# Patient Record
Sex: Male | Born: 1980 | Race: Black or African American | Hispanic: No | Marital: Single | Smoking: Current every day smoker
Health system: Southern US, Community
[De-identification: ages and names within clinical notes are randomized; demographics above are authoritative.]

## PROBLEM LIST (undated history)

## (undated) HISTORY — PX: OTHER SURGICAL HISTORY: SHX169

---

## 2001-10-02 ENCOUNTER — Encounter: Payer: Self-pay | Admitting: Emergency Medicine

## 2001-10-02 ENCOUNTER — Emergency Department (HOSPITAL_COMMUNITY): Admission: EM | Admit: 2001-10-02 | Discharge: 2001-10-02 | Payer: Self-pay | Admitting: Emergency Medicine

## 2003-06-16 ENCOUNTER — Emergency Department (HOSPITAL_COMMUNITY): Admission: EM | Admit: 2003-06-16 | Discharge: 2003-06-17 | Payer: Self-pay | Admitting: Emergency Medicine

## 2003-06-17 ENCOUNTER — Encounter: Payer: Self-pay | Admitting: Emergency Medicine

## 2003-06-19 ENCOUNTER — Emergency Department (HOSPITAL_COMMUNITY): Admission: EM | Admit: 2003-06-19 | Discharge: 2003-06-19 | Payer: Self-pay | Admitting: Emergency Medicine

## 2003-06-22 ENCOUNTER — Emergency Department (HOSPITAL_COMMUNITY): Admission: EM | Admit: 2003-06-22 | Discharge: 2003-06-22 | Payer: Self-pay | Admitting: Emergency Medicine

## 2003-06-26 ENCOUNTER — Emergency Department (HOSPITAL_COMMUNITY): Admission: EM | Admit: 2003-06-26 | Discharge: 2003-06-26 | Payer: Self-pay | Admitting: Emergency Medicine

## 2003-08-25 ENCOUNTER — Emergency Department (HOSPITAL_COMMUNITY): Admission: EM | Admit: 2003-08-25 | Discharge: 2003-08-25 | Payer: Self-pay

## 2003-09-04 ENCOUNTER — Emergency Department (HOSPITAL_COMMUNITY): Admission: EM | Admit: 2003-09-04 | Discharge: 2003-09-04 | Payer: Self-pay | Admitting: Emergency Medicine

## 2003-09-13 ENCOUNTER — Emergency Department (HOSPITAL_COMMUNITY): Admission: EM | Admit: 2003-09-13 | Discharge: 2003-09-13 | Payer: Self-pay | Admitting: Emergency Medicine

## 2003-11-16 ENCOUNTER — Emergency Department (HOSPITAL_COMMUNITY): Admission: EM | Admit: 2003-11-16 | Discharge: 2003-11-17 | Payer: Self-pay | Admitting: Emergency Medicine

## 2004-01-29 ENCOUNTER — Emergency Department (HOSPITAL_COMMUNITY): Admission: EM | Admit: 2004-01-29 | Discharge: 2004-01-29 | Payer: Self-pay | Admitting: Emergency Medicine

## 2004-02-10 ENCOUNTER — Emergency Department (HOSPITAL_COMMUNITY): Admission: EM | Admit: 2004-02-10 | Discharge: 2004-02-10 | Payer: Self-pay | Admitting: Emergency Medicine

## 2004-08-31 ENCOUNTER — Emergency Department (HOSPITAL_COMMUNITY): Admission: EM | Admit: 2004-08-31 | Discharge: 2004-08-31 | Payer: Self-pay | Admitting: Emergency Medicine

## 2008-08-22 ENCOUNTER — Emergency Department (HOSPITAL_COMMUNITY): Admission: EM | Admit: 2008-08-22 | Discharge: 2008-08-22 | Payer: Self-pay | Admitting: Emergency Medicine

## 2008-10-09 ENCOUNTER — Emergency Department (HOSPITAL_COMMUNITY): Admission: EM | Admit: 2008-10-09 | Discharge: 2008-10-09 | Payer: Self-pay | Admitting: Family Medicine

## 2009-08-23 ENCOUNTER — Emergency Department (HOSPITAL_COMMUNITY): Admission: EM | Admit: 2009-08-23 | Discharge: 2009-08-24 | Payer: Self-pay | Admitting: Emergency Medicine

## 2009-11-09 ENCOUNTER — Emergency Department (HOSPITAL_COMMUNITY): Admission: EM | Admit: 2009-11-09 | Discharge: 2009-11-10 | Payer: Self-pay | Admitting: Emergency Medicine

## 2010-01-05 ENCOUNTER — Emergency Department (HOSPITAL_COMMUNITY): Admission: EM | Admit: 2010-01-05 | Discharge: 2010-01-05 | Payer: Self-pay | Admitting: Emergency Medicine

## 2010-04-23 ENCOUNTER — Emergency Department (HOSPITAL_COMMUNITY): Admission: EM | Admit: 2010-04-23 | Discharge: 2010-04-23 | Payer: Self-pay | Admitting: Emergency Medicine

## 2010-05-04 ENCOUNTER — Emergency Department (HOSPITAL_COMMUNITY): Admission: EM | Admit: 2010-05-04 | Discharge: 2010-05-04 | Payer: Self-pay | Admitting: Emergency Medicine

## 2010-07-24 ENCOUNTER — Emergency Department (HOSPITAL_COMMUNITY): Admission: EM | Admit: 2010-07-24 | Discharge: 2010-07-24 | Payer: Self-pay | Admitting: Emergency Medicine

## 2010-08-02 ENCOUNTER — Emergency Department (HOSPITAL_COMMUNITY): Admission: EM | Admit: 2010-08-02 | Discharge: 2010-08-02 | Payer: Self-pay | Admitting: Family Medicine

## 2010-11-22 ENCOUNTER — Emergency Department (HOSPITAL_COMMUNITY)
Admission: EM | Admit: 2010-11-22 | Discharge: 2010-11-22 | Payer: Self-pay | Source: Home / Self Care | Admitting: Emergency Medicine

## 2011-01-14 ENCOUNTER — Emergency Department (HOSPITAL_COMMUNITY): Admission: EM | Admit: 2011-01-14 | Discharge: 2011-01-14 | Source: Home / Self Care | Admitting: Emergency Medicine

## 2011-03-12 LAB — WOUND CULTURE

## 2011-03-14 LAB — BASIC METABOLIC PANEL
BUN: 8 mg/dL (ref 6–23)
Chloride: 108 mEq/L (ref 96–112)
GFR calc non Af Amer: >60 mL/min (ref >60)
Glucose, Bld: 94 mg/dL (ref 70–99)
Potassium: 3.9 mEq/L (ref 3.5–5.1)
Sodium: 141 mEq/L (ref 135–145)

## 2011-03-14 LAB — DIFFERENTIAL
Eosinophils Absolute: 0.1 10*3/uL (ref 0.0–0.7)
Lymphs Abs: 2.3 10*3/uL (ref 0.7–4.0)
Monocytes Absolute: 0.2 10*3/uL (ref 0.1–1.0)
Monocytes Relative: 3 % (ref 3–12)
Neutro Abs: 4.2 10*3/uL (ref 1.7–7.7)

## 2011-03-14 LAB — CBC
Hemoglobin: 13.3 g/dL (ref 13.0–17.0)
MCV: 93.3 fL (ref 78.0–100.0)
WBC: 6.8 10*3/uL (ref 4.0–10.5)

## 2011-04-03 ENCOUNTER — Inpatient Hospital Stay (INDEPENDENT_AMBULATORY_CARE_PROVIDER_SITE_OTHER)
Admission: RE | Admit: 2011-04-03 | Discharge: 2011-04-03 | Disposition: A | Discharge status: Home / Self Care | Payer: Self-pay | Source: Ambulatory Visit | Attending: Family Medicine | Admitting: Family Medicine

## 2011-04-03 ENCOUNTER — Ambulatory Visit (INDEPENDENT_AMBULATORY_CARE_PROVIDER_SITE_OTHER): Payer: Self-pay

## 2011-04-03 DIAGNOSIS — M79609 Pain in unspecified limb: Secondary | ICD-10-CM

## 2011-07-04 ENCOUNTER — Inpatient Hospital Stay (INDEPENDENT_AMBULATORY_CARE_PROVIDER_SITE_OTHER)
Admission: RE | Admit: 2011-07-04 | Discharge: 2011-07-04 | Disposition: A | Discharge status: Home / Self Care | Payer: Self-pay | Source: Ambulatory Visit | Attending: Emergency Medicine | Admitting: Emergency Medicine

## 2011-07-04 DIAGNOSIS — M549 Dorsalgia, unspecified: Secondary | ICD-10-CM

## 2011-07-04 DIAGNOSIS — L723 Sebaceous cyst: Secondary | ICD-10-CM

## 2011-08-06 ENCOUNTER — Emergency Department (HOSPITAL_COMMUNITY): Payer: Self-pay

## 2011-08-06 ENCOUNTER — Emergency Department (HOSPITAL_COMMUNITY)
Admission: EM | Admit: 2011-08-06 | Discharge: 2011-08-06 | Disposition: A | Discharge status: Home / Self Care | Payer: Self-pay

## 2011-08-06 DIAGNOSIS — R4182 Altered mental status, unspecified: Secondary | ICD-10-CM | POA: Insufficient documentation

## 2011-08-06 DIAGNOSIS — Y998 Other external cause status: Secondary | ICD-10-CM | POA: Insufficient documentation

## 2011-08-06 DIAGNOSIS — T07XXXA Unspecified multiple injuries, initial encounter: Secondary | ICD-10-CM

## 2011-08-06 DIAGNOSIS — Y9239 Other specified sports and athletic area as the place of occurrence of the external cause: Secondary | ICD-10-CM | POA: Insufficient documentation

## 2011-08-06 DIAGNOSIS — F1010 Alcohol abuse, uncomplicated: Secondary | ICD-10-CM | POA: Insufficient documentation

## 2011-08-06 DIAGNOSIS — S01409A Unspecified open wound of unspecified cheek and temporomandibular area, initial encounter: Secondary | ICD-10-CM | POA: Insufficient documentation

## 2011-08-06 DIAGNOSIS — S0083XA Contusion of other part of head, initial encounter: Secondary | ICD-10-CM | POA: Insufficient documentation

## 2011-08-06 DIAGNOSIS — S0003XA Contusion of scalp, initial encounter: Secondary | ICD-10-CM | POA: Insufficient documentation

## 2011-08-06 DIAGNOSIS — S022XXA Fracture of nasal bones, initial encounter for closed fracture: Secondary | ICD-10-CM | POA: Insufficient documentation

## 2011-08-06 LAB — POCT I-STAT, CHEM 8
BUN: 7 mg/dL (ref 6–23)
Calcium, Ion: 1.07 mmol/L — ABNORMAL LOW (ref 1.12–1.32)
Chloride: 104 meq/L (ref 96–112)
Creatinine, Ser: 1.6 mg/dL — ABNORMAL HIGH (ref 0.50–1.35)
Sodium: 144 meq/L (ref 135–145)

## 2011-08-06 LAB — COMPREHENSIVE METABOLIC PANEL
ALT: 12 U/L (ref 0–53)
AST: 19 U/L (ref 0–37)
Calcium: 8.8 mg/dL (ref 8.4–10.5)
Sodium: 141 mEq/L (ref 135–145)
Total Protein: 6.8 g/dL (ref 6.0–8.3)

## 2011-08-06 LAB — CBC
Hemoglobin: 12.5 g/dL — ABNORMAL LOW (ref 13.0–17.0)
MCV: 91.5 fL (ref 78.0–100.0)
Platelets: 270 10*3/uL (ref 150–400)
RBC: 4.13 MIL/uL — ABNORMAL LOW (ref 4.22–5.81)
WBC: 7.2 10*3/uL (ref 4.0–10.5)

## 2011-08-06 LAB — TYPE AND SCREEN: ABO/RH(D): O POS

## 2011-08-06 LAB — LACTIC ACID, PLASMA: Lactic Acid, Venous: 2.8 mmol/L — ABNORMAL HIGH (ref 0.5–2.2)

## 2011-08-06 LAB — URINALYSIS, ROUTINE W REFLEX MICROSCOPIC
Glucose, UA: NEGATIVE mg/dL
Hgb urine dipstick: NEGATIVE
Specific Gravity, Urine: 1.039 — ABNORMAL HIGH (ref 1.005–1.030)
Urobilinogen, UA: 0.2 mg/dL (ref 0.0–1.0)
pH: 5 (ref 5.0–8.0)

## 2011-08-06 LAB — RAPID URINE DRUG SCREEN, HOSP PERFORMED
Amphetamines: NOT DETECTED
Cocaine: NOT DETECTED
Opiates: NOT DETECTED
Tetrahydrocannabinol: POSITIVE — AB

## 2011-08-06 LAB — ETHANOL: Alcohol, Ethyl (B): 249 mg/dL — ABNORMAL HIGH (ref 0–11)

## 2011-08-06 LAB — PROTIME-INR: Prothrombin Time: 13.2 seconds (ref 11.6–15.2)

## 2011-08-06 MED ORDER — IOHEXOL 300 MG/ML  SOLN
100.0000 mL | Freq: Once | INTRAMUSCULAR | Status: AC | PRN
  Administered 2011-08-06: 100 mL via INTRAVENOUS

## 2011-08-14 ENCOUNTER — Emergency Department (HOSPITAL_COMMUNITY)
Admission: EM | Admit: 2011-08-14 | Discharge: 2011-08-15 | Disposition: A | Discharge status: Home / Self Care | Payer: Self-pay | Attending: Emergency Medicine | Admitting: Emergency Medicine

## 2011-08-14 DIAGNOSIS — Z4802 Encounter for removal of sutures: Secondary | ICD-10-CM | POA: Insufficient documentation
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
## 2011-08-30 ENCOUNTER — Emergency Department (HOSPITAL_COMMUNITY)
Admission: EM | Admit: 2011-08-30 | Discharge: 2011-08-30 | Disposition: A | Discharge status: Home / Self Care | Payer: Self-pay | Attending: Emergency Medicine | Admitting: Emergency Medicine

## 2011-08-30 DIAGNOSIS — R109 Unspecified abdominal pain: Secondary | ICD-10-CM | POA: Insufficient documentation

## 2011-08-30 DIAGNOSIS — M545 Low back pain, unspecified: Secondary | ICD-10-CM | POA: Insufficient documentation

## 2011-08-30 DIAGNOSIS — R319 Hematuria, unspecified: Secondary | ICD-10-CM | POA: Insufficient documentation

## 2011-08-30 LAB — URINALYSIS, ROUTINE W REFLEX MICROSCOPIC
Glucose, UA: NEGATIVE mg/dL
Specific Gravity, Urine: 1.023 (ref 1.005–1.030)

## 2011-09-01 ENCOUNTER — Emergency Department (HOSPITAL_COMMUNITY)
Admission: EM | Admit: 2011-09-01 | Discharge: 2011-09-02 | Disposition: A | Discharge status: Home / Self Care | Payer: Self-pay | Attending: Emergency Medicine | Admitting: Emergency Medicine

## 2011-09-01 DIAGNOSIS — R109 Unspecified abdominal pain: Secondary | ICD-10-CM | POA: Insufficient documentation

## 2011-09-01 DIAGNOSIS — R319 Hematuria, unspecified: Secondary | ICD-10-CM | POA: Insufficient documentation

## 2011-09-01 LAB — URINALYSIS, ROUTINE W REFLEX MICROSCOPIC
Bilirubin Urine: NEGATIVE
Glucose, UA: NEGATIVE mg/dL
Protein, ur: NEGATIVE mg/dL
Specific Gravity, Urine: 1.023 (ref 1.005–1.030)
Urobilinogen, UA: 1 mg/dL (ref 0.0–1.0)

## 2011-09-02 ENCOUNTER — Emergency Department (HOSPITAL_COMMUNITY): Payer: Self-pay

## 2013-05-17 ENCOUNTER — Emergency Department (HOSPITAL_COMMUNITY)
Admission: EM | Admit: 2013-05-17 | Discharge: 2013-05-17 | Disposition: A | Discharge status: Home / Self Care | Payer: Self-pay | Attending: Emergency Medicine | Admitting: Emergency Medicine

## 2013-05-17 ENCOUNTER — Encounter (HOSPITAL_COMMUNITY): Payer: Self-pay | Admitting: Emergency Medicine

## 2013-05-17 DIAGNOSIS — IMO0001 Reserved for inherently not codable concepts without codable children: Secondary | ICD-10-CM | POA: Insufficient documentation

## 2013-05-17 DIAGNOSIS — J329 Chronic sinusitis, unspecified: Secondary | ICD-10-CM | POA: Insufficient documentation

## 2013-05-17 DIAGNOSIS — R509 Fever, unspecified: Secondary | ICD-10-CM | POA: Insufficient documentation

## 2013-05-17 DIAGNOSIS — R1110 Vomiting, unspecified: Secondary | ICD-10-CM | POA: Insufficient documentation

## 2013-05-17 DIAGNOSIS — R61 Generalized hyperhidrosis: Secondary | ICD-10-CM | POA: Insufficient documentation

## 2013-05-17 DIAGNOSIS — R05 Cough: Secondary | ICD-10-CM | POA: Insufficient documentation

## 2013-05-17 DIAGNOSIS — R059 Cough, unspecified: Secondary | ICD-10-CM | POA: Insufficient documentation

## 2013-05-17 DIAGNOSIS — R0982 Postnasal drip: Secondary | ICD-10-CM | POA: Insufficient documentation

## 2013-05-17 DIAGNOSIS — H9209 Otalgia, unspecified ear: Secondary | ICD-10-CM | POA: Insufficient documentation

## 2013-05-17 DIAGNOSIS — B9689 Other specified bacterial agents as the cause of diseases classified elsewhere: Secondary | ICD-10-CM

## 2013-05-17 DIAGNOSIS — F17200 Nicotine dependence, unspecified, uncomplicated: Secondary | ICD-10-CM | POA: Insufficient documentation

## 2013-05-17 DIAGNOSIS — L723 Sebaceous cyst: Secondary | ICD-10-CM | POA: Insufficient documentation

## 2013-05-17 DIAGNOSIS — K5900 Constipation, unspecified: Secondary | ICD-10-CM | POA: Insufficient documentation

## 2013-05-17 DIAGNOSIS — J069 Acute upper respiratory infection, unspecified: Secondary | ICD-10-CM | POA: Diagnosis present

## 2013-05-17 DIAGNOSIS — R220 Localized swelling, mass and lump, head: Secondary | ICD-10-CM | POA: Insufficient documentation

## 2013-05-17 MED ORDER — AMOXICILLIN 500 MG PO CAPS: 500.0000 mg | ORAL_CAPSULE | Freq: Two times a day (BID) | ORAL | Status: DC

## 2013-05-17 MED ORDER — PSEUDOEPHEDRINE HCL 30 MG PO TABS: 30.0000 mg | ORAL_TABLET | Freq: Four times a day (QID) | ORAL | Status: DC | PRN

## 2013-05-17 MED ORDER — IBUPROFEN 800 MG PO TABS: 800.0000 mg | ORAL_TABLET | Freq: Once | ORAL | Status: DC

## 2013-05-17 MED ORDER — IBUPROFEN 800 MG PO TABS
800.0000 mg | ORAL_TABLET | Freq: Once | ORAL | Status: AC
  Administered 2013-05-17: 800 mg via ORAL
  Filled 2013-05-17: qty 1

## 2013-05-17 NOTE — ED Notes (Addendum)
Pt presents to ED today with c/o sinus infection that started Monday, facial pain. Patient stated he took OTC meds without relief. Pt also has large growth on back of neck he would like checked.

## 2013-05-17 NOTE — ED Provider Notes (Signed)
History     CSN: [id number] Arrival date & time 05/17/13  1725  First MD Initiated Contact with Patient 05/17/13 1739     Chief Complaint  Patient presents with  . Recurrent Sinusitis   HPI Comments: Patient is a 32 year old otherwise healthy male who presents with complaints:  # nasal congestion: Patient reports an 8 day history of nasal congestion that began with a small amount rhinorrhea which seem to be improving until 5 days ago when he started having increasing nasal congestion, painful chewing his teeth, pressure behind his eyes and pressure in his ears.  No overt hearing loss.  Denies any objective fevers but does report becoming diaphoretic on occasion.  No rigors.  Reports a two-day onset of mildly productive cough with occasional post-tussive emesis.  Non bloody non bilious.  He is a smoker.  He is try taking Claritin and and over-the-counter cold and flu medicine.  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 Reports that it has continued to grow and is now causing him discomfort when he lays supine.  Reports that his father had a similar cyst.  Reports that his never drain or become significantly inflamed.  He is not seen a surgeon for this previously and was told he would need insurance to have this taken care of.  He is not apply for insurance through healthcare exchange.   He does not have a primary care provider.   History reviewed. No pertinent past medical history.  History reviewed. No pertinent past surgical history.  History reviewed. No pertinent family history.  History  Substance Use Topics  . Smoking status: Current Every Day Smoker    Types: Cigarettes  . Smokeless tobacco: Not on file  . Alcohol Use: Yes    Review of Systems  Constitutional: Positive for fever (subjective only) and diaphoresis. Negative for chills, activity change, appetite change and  fatigue.  HENT: Positive for hearing loss (dullness in his hearing but no overt loss), ear pain (pressure only), congestion, facial swelling, rhinorrhea, dental problem (pain with chewing in his upper teeth), postnasal drip and sinus pressure. Negative for nosebleeds, sore throat, sneezing, drooling, mouth sores, trouble swallowing, neck pain, neck stiffness, voice change, tinnitus and ear discharge.   Eyes: Negative for photophobia, pain, discharge, redness and visual disturbance.  Respiratory: Positive for cough. Negative for chest tightness, shortness of breath, wheezing and stridor.   Cardiovascular: Negative for chest pain, palpitations and leg swelling.  Gastrointestinal: Positive for constipation. Negative for nausea, vomiting, abdominal pain, diarrhea, blood in stool, abdominal distention, anal bleeding and rectal pain.  Endocrine: Negative.   Genitourinary: Negative for dysuria, frequency, flank pain and difficulty urinating.  Musculoskeletal: Positive for myalgias (generalized whole body at the beginning of his symptoms).  Skin: Negative.   Allergic/Immunologic: Negative.  Negative for environmental allergies.  Neurological: Negative.   Hematological: Negative.     Allergies  Tuberculin tests  Home Medications  No current outpatient prescriptions on file.  BP 120/83  Pulse 79  Temp(Src) 97.7 F (36.5 C) (Oral)  Resp 14  SpO2 99%  Physical Exam  Nursing note and vitals reviewed. Constitutional: He appears well-developed and well-nourished.  Non-toxic appearance. He does not appear ill. No distress.  HENT:  Head: Normocephalic and atraumatic. Head is without raccoon's eyes.  Right Ear: Hearing normal. No mastoid tenderness. Tympanic membrane is injected. Tympanic membrane is not perforated and  not erythematous. Tympanic membrane mobility is normal. A middle ear effusion is present.  Left Ear: Hearing normal. No mastoid tenderness. Tympanic membrane is not injected, not  perforated and not erythematous. Tympanic membrane mobility is normal. A middle ear effusion is present.  Nose: Mucosal edema, rhinorrhea and sinus tenderness present. No septal deviation or nasal septal hematoma. No epistaxis. Right sinus exhibits maxillary sinus tenderness. Left sinus exhibits maxillary sinus tenderness.  Eyes: Conjunctivae are normal. Right eye exhibits no discharge. Left eye exhibits no discharge. No scleral icterus.  Neck: No JVD present. No tracheal deviation present.    Cardiovascular: Normal rate, regular rhythm, normal heart sounds and intact distal pulses.  Exam reveals no gallop and no friction rub.   No murmur heard. Pulmonary/Chest: Effort normal and breath sounds normal. No respiratory distress. He has no wheezes. He has no rales.  Abdominal: Soft.  Musculoskeletal: Normal range of motion. He exhibits no edema.  Neurological: He is alert. He exhibits normal muscle tone.  Skin: Skin is warm and dry. No rash noted. He is not diaphoretic. No erythema. No pallor.  Psychiatric: He has a normal mood and affect. His behavior is normal. Judgment and thought content normal.   Superficial ultrasound: Ultrasound of the mass on the posterior aspect of the neck reveals a complex non-fluid filled structure.   ED Course  Procedures (including critical care time)  Labs Reviewed - No data to display No results found.   1. URI (upper respiratory infection)    MDM  Patient vital signs are stable.  He does have symptoms consistent with viral upper respiratory tract infection that has questionably progressing to bacterial sinusitis.  Given lack of follow up we'll provide him with a course of antibiotics as well as decongestants.    The cyst on is neck is been confirmed with ultrasound as a solid structure.  The patient will need follow up with a surgeon for consideration of excision.  It is not erythematous, it is nonpainful.  I recommend further evaluation prior to treatment.   Patient provided resources for establishing with a PCP well as [email or web address] and [email or web address].   Andrena Mews, DO 05/17/13 1850

## 2013-05-18 NOTE — ED Provider Notes (Signed)
I saw and evaluated the patient, reviewed the resident's note and I agree with the findings and plan.   .Face to face Exam:  General:  Awake HEENT:  Atraumatic Resp:  Normal effort Abd:  Nondistended Neuro:No focal weakness   Nelia Shi, MD 05/18/13 1108

## 2013-06-03 ENCOUNTER — Encounter (HOSPITAL_COMMUNITY): Payer: Self-pay | Admitting: Emergency Medicine

## 2013-06-03 ENCOUNTER — Emergency Department (INDEPENDENT_AMBULATORY_CARE_PROVIDER_SITE_OTHER)
Admission: EM | Admit: 2013-06-03 | Discharge: 2013-06-03 | Disposition: A | Discharge status: Home / Self Care | Payer: Self-pay | Source: Home / Self Care | Attending: Emergency Medicine | Admitting: Emergency Medicine

## 2013-06-03 DIAGNOSIS — M5430 Sciatica, unspecified side: Secondary | ICD-10-CM

## 2013-06-03 MED ORDER — CYCLOBENZAPRINE HCL 5 MG PO TABS: 5.0000 mg | ORAL_TABLET | Freq: Three times a day (TID) | ORAL | Status: DC | PRN

## 2013-06-03 MED ORDER — HYDROCODONE-ACETAMINOPHEN 5-325 MG PO TABS
ORAL_TABLET | ORAL | Status: AC
  Filled 2013-06-03: qty 2

## 2013-06-03 MED ORDER — HYDROCODONE-ACETAMINOPHEN 5-325 MG PO TABS: ORAL_TABLET | ORAL | Status: DC

## 2013-06-03 MED ORDER — PREDNISONE 20 MG PO TABS: ORAL_TABLET | ORAL | Status: DC

## 2013-06-03 MED ORDER — HYDROCODONE-ACETAMINOPHEN 5-325 MG PO TABS
2.0000 | ORAL_TABLET | Freq: Once | ORAL | Status: AC
  Administered 2013-06-03: 2 via ORAL

## 2013-06-03 MED ORDER — IBUPROFEN 800 MG PO TABS: 800.0000 mg | ORAL_TABLET | Freq: Three times a day (TID) | ORAL | Status: DC
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
## 2013-12-30 ENCOUNTER — Other Ambulatory Visit: Payer: Self-pay | Admitting: Gastroenterology

## 2013-12-30 NOTE — Addendum Note (Signed)
Addended by: Dorena CookeyHAYES, Cyrene Gharibian on: 12/30/2013 04:17 PM   Modules accepted: Orders

## 2014-01-05 ENCOUNTER — Ambulatory Visit (HOSPITAL_COMMUNITY): Payer: PRIVATE HEALTH INSURANCE | Admitting: Anesthesiology

## 2014-01-05 ENCOUNTER — Encounter (HOSPITAL_COMMUNITY): Payer: PRIVATE HEALTH INSURANCE | Admitting: Anesthesiology

## 2014-01-05 ENCOUNTER — Encounter (HOSPITAL_COMMUNITY)
Admission: RE | Disposition: A | Discharge status: Home / Self Care | Payer: Self-pay | Source: Ambulatory Visit | Attending: Gastroenterology

## 2014-01-05 ENCOUNTER — Encounter (HOSPITAL_COMMUNITY): Payer: Self-pay | Admitting: Certified Registered Nurse Anesthetist

## 2014-01-05 ENCOUNTER — Ambulatory Visit (HOSPITAL_COMMUNITY)
Admission: RE | Admit: 2014-01-05 | Discharge: 2014-01-05 | Disposition: A | Discharge status: Home / Self Care | Payer: PRIVATE HEALTH INSURANCE | Source: Ambulatory Visit | Attending: Gastroenterology | Admitting: Gastroenterology

## 2014-01-05 DIAGNOSIS — J069 Acute upper respiratory infection, unspecified: Secondary | ICD-10-CM

## 2014-01-05 DIAGNOSIS — K6289 Other specified diseases of anus and rectum: Secondary | ICD-10-CM | POA: Insufficient documentation

## 2014-01-05 DIAGNOSIS — K625 Hemorrhage of anus and rectum: Secondary | ICD-10-CM | POA: Insufficient documentation

## 2014-01-05 DIAGNOSIS — K626 Ulcer of anus and rectum: Secondary | ICD-10-CM | POA: Insufficient documentation

## 2014-01-05 HISTORY — PX: COLONOSCOPY: SHX5424

## 2014-01-05 SURGERY — COLONOSCOPY
Anesthesia: Monitor Anesthesia Care

## 2014-01-05 MED ORDER — LACTATED RINGERS IV SOLN
INTRAVENOUS | Status: DC
  Administered 2014-01-05: 1000 mL via INTRAVENOUS

## 2014-01-05 MED ORDER — PROPOFOL 10 MG/ML IV BOLUS
INTRAVENOUS | Status: AC
  Filled 2014-01-05: qty 20

## 2014-01-05 MED ORDER — SODIUM CHLORIDE 0.9 % IV SOLN: INTRAVENOUS | Status: DC

## 2014-01-05 MED ORDER — MIDAZOLAM HCL 2 MG/2ML IJ SOLN
INTRAMUSCULAR | Status: AC
  Filled 2014-01-05: qty 2

## 2014-01-05 MED ORDER — FENTANYL CITRATE 0.05 MG/ML IJ SOLN
INTRAMUSCULAR | Status: DC | PRN
Start: 2014-01-05 — End: 2014-01-05
  Administered 2014-01-05 (×2): 50 ug via INTRAVENOUS

## 2014-01-05 MED ORDER — MIDAZOLAM HCL 5 MG/5ML IJ SOLN
INTRAMUSCULAR | Status: DC | PRN
  Administered 2014-01-05 (×2): 1 mg via INTRAVENOUS

## 2014-01-05 MED ORDER — PROPOFOL 10 MG/ML IV BOLUS
INTRAVENOUS | Status: DC | PRN
  Administered 2014-01-05: 40 mg via INTRAVENOUS
  Administered 2014-01-05: 80 mg via INTRAVENOUS

## 2014-01-05 MED ORDER — FENTANYL CITRATE 0.05 MG/ML IJ SOLN
INTRAMUSCULAR | Status: AC
  Filled 2014-01-05: qty 2

## 2014-01-05 NOTE — Anesthesia Postprocedure Evaluation (Signed)
Anesthesia Post Note  Patient: Johnny EtienneKeith D Mclouth  Procedure(s) Performed: Procedure(s) (LRB): COLONOSCOPY (N/A)  Anesthesia type: MAC  Patient location: PACU  Post pain: Pain level controlled  Post assessment: Post-op Vital signs reviewed  Last Vitals: BP 97/59  Pulse 54  Temp(Src) 36.3 C (Oral)  Resp 16  Ht 5\' 9"  (1.753 m)  Wt 155 lb (70.308 kg)  BMI 22.88 kg/m2  SpO2 98%  Post vital signs: Reviewed  Level of consciousness: awake  Complications: No apparent anesthesia complications

## 2014-01-05 NOTE — Anesthesia Preprocedure Evaluation (Signed)
Anesthesia Evaluation  Patient identified by MRN, date of birth, ID band Patient awake    Reviewed: Allergy & Precautions, H&P , NPO status , Patient's Chart, lab work & pertinent test results  Airway Mallampati: II TM Distance: >3 FB Neck ROM: Full    Dental  (+) Dental Advisory Given   Pulmonary Current Smoker,  breath sounds clear to auscultation        Cardiovascular negative cardio ROS  Rhythm:Regular Rate:Normal     Neuro/Psych negative neurological ROS  negative psych ROS   GI/Hepatic negative GI ROS, Neg liver ROS,   Endo/Other  negative endocrine ROS  Renal/GU negative Renal ROS     Musculoskeletal negative musculoskeletal ROS (+)   Abdominal   Peds  Hematology negative hematology ROS (+)   Anesthesia Other Findings   Reproductive/Obstetrics                           Anesthesia Physical Anesthesia Plan  ASA: II  Anesthesia Plan: MAC   Post-op Pain Management:    Induction: Intravenous  Airway Management Planned: Natural Airway  Additional Equipment:   Intra-op Plan:   Post-operative Plan:   Informed Consent: I have reviewed the patients History and Physical, chart, labs and discussed the procedure including the risks, benefits and alternatives for the proposed anesthesia with the patient or authorized representative who has indicated his/her understanding and acceptance.   Dental advisory given  Plan Discussed with: CRNA  Anesthesia Plan Comments:         Anesthesia Quick Evaluation

## 2014-01-05 NOTE — Preoperative (Signed)
Beta Blockers   Reason not to administer Beta Blockers:Not Applicable 

## 2014-01-05 NOTE — Transfer of Care (Signed)
Immediate Anesthesia Transfer of Care Note  Patient: Johnny George  Procedure(s) Performed: Procedure(s) with comments: COLONOSCOPY (N/A) - [facility]   Patient Location: PACU and Endoscopy Unit  Anesthesia Type:MAC  Level of Consciousness: awake, oriented, patient cooperative, lethargic and responds to stimulation  Airway & Oxygen Therapy: Patient connected to face mask oxygen  Post-op Assessment: Report given to PACU RN, Post -op Vital signs reviewed and stable and Patient moving all extremities  Post vital signs: Reviewed and stable  Complications: No apparent anesthesia complications

## 2014-01-05 NOTE — Op Note (Signed)
[facility] [address] [place], [id number]   OPERATIVE PROCEDURE REPORT  PATIENT: [name], Johnny D.  MR#: [id number] BIRTHDATE: 07/06/1980  GENDER: Male ENDOSCOPIST: Dorena CookeyJohn Dayne Chait, MD ASSISTANT:   Beryle BeamsJanie Billups, technician Anthony Saraniel Madden, RN PROCEDURE DATE: 01/05/2014 PROCEDURE: ASA CLASS: INDICATIONS:rectal bleeding MEDICATIONS: propofol per anesthesia team  DESCRIPTION OF PROCEDURE:   After the risks benefits and alternatives of the procedure were thoroughly explained, informed consent was obtained.        The Pentax Ped Colon [id number] endoscope was introduced through the anus  and advanced to the cecum      , No adverse events experienced.    The quality of the prep was fair to good.      .  The instrument was then slowly withdrawn as the colon was fully examined.cecum, a ascending transverse descending and sigmoid colon all appeared normal with no masses polyps diverticula or other mucosal abnormalities. The rectum showed a few areas of superficial erythema and friability with minimal granularity and edema all very nonspecific. No strong stigmata of hemorrhage. Biopsies were taken of the distal rectum to rule out proctitis. Retroflexed view of the anus showed only minimal internal hemorrhoids.   The scope was then withdrawn from the patient and the procedure terminated.  COMPLICATIONS: There were no complications.  IMPRESSION:scant hemorrhagic mucosa with possibleminimal proctitis. No significant hemorrhoids no abnormalities proximal to the rectum RECOMMENDATIONS:await histology and we'll probably treat with topical 5 ASA suppositories.  _______________________________ Rosalie DoctoreSignedDorena Cookey:  Johnny Zeiser, MD 01/05/2014 10:30 AM

## 2014-01-06 ENCOUNTER — Encounter (HOSPITAL_COMMUNITY): Payer: Self-pay | Admitting: Gastroenterology

## 2014-01-07 NOTE — OR Nursing (Signed)
[facility]ique [facility] called requesting a copy of the report for Johnny DevonKeith George for procedures performed 01/05/14.  She faxed a Release of Information form and a copy of the Operative Procedure Report was faxed to her at [phone].

## 2014-12-02 ENCOUNTER — Emergency Department (HOSPITAL_COMMUNITY): Payer: No Typology Code available for payment source

## 2014-12-02 ENCOUNTER — Inpatient Hospital Stay (HOSPITAL_COMMUNITY)
Admission: EM | Admit: 2014-12-02 | Discharge: 2014-12-15 | DRG: 356 | Disposition: A | Discharge status: Home Health | Payer: No Typology Code available for payment source | Attending: General Surgery | Admitting: General Surgery

## 2014-12-02 ENCOUNTER — Emergency Department (HOSPITAL_COMMUNITY): Payer: No Typology Code available for payment source | Admitting: Anesthesiology

## 2014-12-02 ENCOUNTER — Inpatient Hospital Stay (HOSPITAL_COMMUNITY): Payer: No Typology Code available for payment source

## 2014-12-02 ENCOUNTER — Encounter (HOSPITAL_COMMUNITY): Admission: EM | Disposition: A | Discharge status: Home Health | Payer: MEDICAID | Source: Home / Self Care

## 2014-12-02 DIAGNOSIS — S31139A Puncture wound of abdominal wall without foreign body, unspecified quadrant without penetration into peritoneal cavity, initial encounter: Secondary | ICD-10-CM

## 2014-12-02 DIAGNOSIS — S36113A Laceration of liver, unspecified degree, initial encounter: Secondary | ICD-10-CM | POA: Diagnosis present

## 2014-12-02 DIAGNOSIS — W3400XA Accidental discharge from unspecified firearms or gun, initial encounter: Secondary | ICD-10-CM

## 2014-12-02 DIAGNOSIS — R61 Generalized hyperhidrosis: Secondary | ICD-10-CM | POA: Diagnosis present

## 2014-12-02 DIAGNOSIS — T1490XA Injury, unspecified, initial encounter: Secondary | ICD-10-CM

## 2014-12-02 DIAGNOSIS — S31609A Unspecified open wound of abdominal wall, unspecified quadrant with penetration into peritoneal cavity, initial encounter: Principal | ICD-10-CM | POA: Diagnosis present

## 2014-12-02 DIAGNOSIS — K5900 Constipation, unspecified: Secondary | ICD-10-CM | POA: Diagnosis not present

## 2014-12-02 DIAGNOSIS — I312 Hemopericardium, not elsewhere classified: Secondary | ICD-10-CM | POA: Diagnosis present

## 2014-12-02 DIAGNOSIS — S21109A Unspecified open wound of unspecified front wall of thorax without penetration into thoracic cavity, initial encounter: Secondary | ICD-10-CM | POA: Diagnosis present

## 2014-12-02 DIAGNOSIS — J9600 Acute respiratory failure, unspecified whether with hypoxia or hypercapnia: Secondary | ICD-10-CM | POA: Diagnosis not present

## 2014-12-02 DIAGNOSIS — D62 Acute posthemorrhagic anemia: Secondary | ICD-10-CM | POA: Diagnosis not present

## 2014-12-02 DIAGNOSIS — J942 Hemothorax: Secondary | ICD-10-CM | POA: Diagnosis present

## 2014-12-02 DIAGNOSIS — J90 Pleural effusion, not elsewhere classified: Secondary | ICD-10-CM | POA: Diagnosis not present

## 2014-12-02 DIAGNOSIS — S21139A Puncture wound without foreign body of unspecified front wall of thorax without penetration into thoracic cavity, initial encounter: Secondary | ICD-10-CM

## 2014-12-02 DIAGNOSIS — T1490 Injury, unspecified: Secondary | ICD-10-CM | POA: Diagnosis present

## 2014-12-02 DIAGNOSIS — D72829 Elevated white blood cell count, unspecified: Secondary | ICD-10-CM | POA: Diagnosis not present

## 2014-12-02 DIAGNOSIS — Z9689 Presence of other specified functional implants: Secondary | ICD-10-CM

## 2014-12-02 DIAGNOSIS — J9811 Atelectasis: Secondary | ICD-10-CM | POA: Diagnosis not present

## 2014-12-02 DIAGNOSIS — Z419 Encounter for procedure for purposes other than remedying health state, unspecified: Secondary | ICD-10-CM

## 2014-12-02 DIAGNOSIS — S2690XA Unspecified injury of heart, unspecified with or without hemopericardium, initial encounter: Secondary | ICD-10-CM | POA: Diagnosis present

## 2014-12-02 DIAGNOSIS — R05 Cough: Secondary | ICD-10-CM

## 2014-12-02 DIAGNOSIS — J9690 Respiratory failure, unspecified, unspecified whether with hypoxia or hypercapnia: Secondary | ICD-10-CM

## 2014-12-02 DIAGNOSIS — R059 Cough, unspecified: Secondary | ICD-10-CM

## 2014-12-02 HISTORY — PX: LAPAROTOMY: SHX154

## 2014-12-02 HISTORY — PX: MEDIASTERNOTOMY: SHX5084

## 2014-12-02 LAB — CBC
HEMATOCRIT: 39.3 % (ref 39.0–52.0)
HEMATOCRIT: 33.8 % — AB (ref 39.0–52.0)
Hemoglobin: 12.8 g/dL — ABNORMAL LOW (ref 13.0–17.0)
Hemoglobin: 11.3 g/dL — ABNORMAL LOW (ref 13.0–17.0)
MCH: 30.3 pg (ref 26.0–34.0)
MCH: 29.4 pg (ref 26.0–34.0)
MCHC: 32.6 g/dL (ref 30.0–36.0)
MCHC: 33.4 g/dL (ref 30.0–36.0)
MCV: 93.1 fL (ref 78.0–100.0)
MCV: 87.8 fL (ref 78.0–100.0)
PLATELETS: 384 10*3/uL (ref 150–400)
PLATELETS: 290 10*3/uL (ref 150–400)
RBC: 4.22 MIL/uL (ref 4.22–5.81)
RBC: 3.85 MIL/uL — ABNORMAL LOW (ref 4.22–5.81)
RDW: 12.5 % (ref 11.5–15.5)
RDW: 13.3 % (ref 11.5–15.5)
WBC: 9.1 10*3/uL (ref 4.0–10.5)
WBC: 19.3 10*3/uL — ABNORMAL HIGH (ref 4.0–10.5)

## 2014-12-02 LAB — POCT I-STAT 3, ART BLOOD GAS (G3+)
ACID-BASE DEFICIT: 2 mmol/L (ref 0.0–2.0)
Acid-base deficit: 3 mmol/L — ABNORMAL HIGH (ref 0.0–2.0)
BICARBONATE: 23.8 meq/L (ref 20.0–24.0)
BICARBONATE: 22.8 meq/L (ref 20.0–24.0)
O2 SAT: 99 %
O2 Saturation: 99 %
PO2 ART: 134 mmHg — AB (ref 80.0–100.0)
TCO2: 25 mmol/L (ref 0–100)
TCO2: 24 mmol/L (ref 0–100)
pCO2 arterial: 50.4 mmHg — ABNORMAL HIGH (ref 35.0–45.0)
pCO2 arterial: 38.6 mmHg (ref 35.0–45.0)
pH, Arterial: 7.281 — ABNORMAL LOW (ref 7.350–7.450)
pH, Arterial: 7.376 (ref 7.350–7.450)
pO2, Arterial: 146 mmHg — ABNORMAL HIGH (ref 80.0–100.0)

## 2014-12-02 LAB — BASIC METABOLIC PANEL
ANION GAP: 11 (ref 5–15)
BUN: 8 mg/dL (ref 6–23)
CALCIUM: 7.9 mg/dL — AB (ref 8.4–10.5)
CO2: 23 mEq/L (ref 19–32)
CREATININE: 0.94 mg/dL (ref 0.50–1.35)
Chloride: 105 mEq/L (ref 96–112)
GFR calc non Af Amer: >90 mL/min (ref >90)
Glucose, Bld: 177 mg/dL — ABNORMAL HIGH (ref 70–99)
Potassium: 4.4 mEq/L (ref 3.7–5.3)
Sodium: 139 mEq/L (ref 137–147)

## 2014-12-02 LAB — COMPREHENSIVE METABOLIC PANEL
ALBUMIN: 3.1 g/dL — AB (ref 3.5–5.2)
ALK PHOS: 57 U/L (ref 39–117)
ALT: 101 U/L — AB (ref 0–53)
AST: 122 U/L — ABNORMAL HIGH (ref 0–37)
Anion gap: 26 — ABNORMAL HIGH (ref 5–15)
BILIRUBIN TOTAL: 0.7 mg/dL (ref 0.3–1.2)
BUN: 8 mg/dL (ref 6–23)
CO2: 14 meq/L — AB (ref 19–32)
CREATININE: 1.33 mg/dL (ref 0.50–1.35)
Calcium: 8.8 mg/dL (ref 8.4–10.5)
Chloride: 100 mEq/L (ref 96–112)
GFR calc Af Amer: 79 mL/min — ABNORMAL LOW (ref >90)
GFR, EST NON AFRICAN AMERICAN: 68 mL/min — AB (ref >90)
Glucose, Bld: 170 mg/dL — ABNORMAL HIGH (ref 70–99)
POTASSIUM: 3.3 meq/L — AB (ref 3.7–5.3)
SODIUM: 140 meq/L (ref 137–147)
Total Protein: 6.1 g/dL (ref 6.0–8.3)

## 2014-12-02 LAB — POCT I-STAT 7, (LYTES, BLD GAS, ICA,H+H)
ACID-BASE DEFICIT: 1 mmol/L (ref 0.0–2.0)
Acid-base deficit: 3 mmol/L — ABNORMAL HIGH (ref 0.0–2.0)
Bicarbonate: 23.9 meq/L (ref 20.0–24.0)
Bicarbonate: 23.2 meq/L (ref 20.0–24.0)
CALCIUM ION: 1.09 mmol/L — AB (ref 1.12–1.23)
CALCIUM ION: 1.12 mmol/L (ref 1.12–1.23)
HCT: 32 % — ABNORMAL LOW (ref 39.0–52.0)
HEMATOCRIT: 36 % — AB (ref 39.0–52.0)
HEMOGLOBIN: 12.2 g/dL — AB (ref 13.0–17.0)
Hemoglobin: 10.9 g/dL — ABNORMAL LOW (ref 13.0–17.0)
O2 SAT: 99 %
O2 Saturation: 100 %
PH ART: 7.297 — AB (ref 7.350–7.450)
PO2 ART: 255 mmHg — AB (ref 80.0–100.0)
Patient temperature: 35.5
Potassium: 3.7 meq/L (ref 3.7–5.3)
Potassium: 3.7 meq/L (ref 3.7–5.3)
SODIUM: 137 meq/L (ref 137–147)
Sodium: 139 meq/L (ref 137–147)
TCO2: 25 mmol/L (ref 0–100)
TCO2: 24 mmol/L (ref 0–100)
pCO2 arterial: 48.2 mmHg — ABNORMAL HIGH (ref 35.0–45.0)
pCO2 arterial: 35.6 mmHg (ref 35.0–45.0)
pH, Arterial: 7.416 (ref 7.350–7.450)
pO2, Arterial: 117 mmHg — ABNORMAL HIGH (ref 80.0–100.0)

## 2014-12-02 LAB — I-STAT CHEM 8, ED
BUN: 7 mg/dL (ref 6–23)
CALCIUM ION: 1.14 mmol/L (ref 1.12–1.23)
CHLORIDE: 102 meq/L (ref 96–112)
CREATININE: 1.5 mg/dL — AB (ref 0.50–1.35)
Glucose, Bld: 168 mg/dL — ABNORMAL HIGH (ref 70–99)
HCT: 42 % (ref 39.0–52.0)
Hemoglobin: 14.3 g/dL (ref 13.0–17.0)
Potassium: 3 mEq/L — ABNORMAL LOW (ref 3.7–5.3)
Sodium: 141 mEq/L (ref 137–147)
TCO2: 17 mmol/L (ref 0–100)

## 2014-12-02 LAB — PREPARE PLATELET PHERESIS: Unit division: 0

## 2014-12-02 LAB — ETHANOL: Alcohol, Ethyl (B): <11 mg/dL (ref 0–11)

## 2014-12-02 LAB — PROTIME-INR
INR: 1.12 (ref 0.00–1.49)
INR: 1.2 (ref 0.00–1.49)
Prothrombin Time: 14.5 seconds (ref 11.6–15.2)
Prothrombin Time: 15.3 seconds — ABNORMAL HIGH (ref 11.6–15.2)

## 2014-12-02 LAB — LACTIC ACID, PLASMA: Lactic Acid, Venous: 9.5 mmol/L — ABNORMAL HIGH (ref 0.5–2.2)

## 2014-12-02 LAB — TRIGLYCERIDES: Triglycerides: 142 mg/dL (ref <150)

## 2014-12-02 LAB — ABO/RH: ABO/RH(D): O POS

## 2014-12-02 LAB — PREPARE RBC (CROSSMATCH)

## 2014-12-02 SURGERY — LAPAROTOMY, EXPLORATORY
Anesthesia: General | Site: Chest | Laterality: Right

## 2014-12-02 MED ORDER — SODIUM CHLORIDE 0.9 % IV SOLN
INTRAVENOUS | Status: DC
  Administered 2014-12-02 – 2014-12-06 (×6): via INTRAVENOUS
  Administered 2014-12-06 – 2014-12-07 (×2): 100 mL via INTRAVENOUS
  Administered 2014-12-07: 22:00:00 via INTRAVENOUS

## 2014-12-02 MED ORDER — SUCCINYLCHOLINE CHLORIDE 20 MG/ML IJ SOLN
INTRAMUSCULAR | Status: DC | PRN
  Administered 2014-12-02: 100 mg via INTRAVENOUS

## 2014-12-02 MED ORDER — CEFAZOLIN SODIUM-DEXTROSE 2-3 GM-% IV SOLR
INTRAVENOUS | Status: AC
  Filled 2014-12-02: qty 50

## 2014-12-02 MED ORDER — TETANUS-DIPHTH-ACELL PERTUSSIS 5-2.5-18.5 LF-MCG/0.5 IM SUSP
INTRAMUSCULAR | Status: AC
  Administered 2014-12-02: 0.5 mL via INTRAMUSCULAR
  Filled 2014-12-02: qty 0.5

## 2014-12-02 MED ORDER — SUCCINYLCHOLINE CHLORIDE 20 MG/ML IJ SOLN
INTRAMUSCULAR | Status: AC
  Filled 2014-12-02: qty 1

## 2014-12-02 MED ORDER — MICROFIBRILLAR COLL HEMOSTAT EX PADS
MEDICATED_PAD | CUTANEOUS | Status: DC | PRN
  Administered 2014-12-02 (×3): 1 via TOPICAL

## 2014-12-02 MED ORDER — SODIUM CHLORIDE 0.9 % IV SOLN: 10.0000 mL/h | Freq: Once | INTRAVENOUS | Status: DC

## 2014-12-02 MED ORDER — DEXTROSE 5 % IV SOLN
2000.0000 mg | INTRAVENOUS | Status: AC | PRN
  Administered 2014-12-02: 2000 mg via INTRAVENOUS

## 2014-12-02 MED ORDER — SODIUM CHLORIDE 0.9 % IV BOLUS (SEPSIS)
1000.0000 mL | Freq: Once | INTRAVENOUS | Status: AC
  Administered 2014-12-02: 1000 mL via INTRAVENOUS

## 2014-12-02 MED ORDER — DEXMEDETOMIDINE HCL IN NACL 200 MCG/50ML IV SOLN
0.1000 ug/kg/h | INTRAVENOUS | Status: DC
  Administered 2014-12-02: 0.4 ug/kg/h via INTRAVENOUS
  Administered 2014-12-02 – 2014-12-04 (×5): 0.2 ug/kg/h via INTRAVENOUS
  Administered 2014-12-04: 0.4 ug/kg/h via INTRAVENOUS
  Administered 2014-12-05: 0.2 ug/kg/h via INTRAVENOUS
  Administered 2014-12-05: 1 ug/kg/h via INTRAVENOUS
  Filled 2014-12-02 (×7): qty 50

## 2014-12-02 MED ORDER — FENTANYL CITRATE 0.05 MG/ML IJ SOLN
INTRAMUSCULAR | Status: AC
  Filled 2014-12-02: qty 2

## 2014-12-02 MED ORDER — 0.9 % SODIUM CHLORIDE (POUR BTL) OPTIME
TOPICAL | Status: DC | PRN
  Administered 2014-12-02: 5000 mL

## 2014-12-02 MED ORDER — FENTANYL CITRATE 0.05 MG/ML IJ SOLN
INTRAMUSCULAR | Status: AC
  Filled 2014-12-02: qty 5

## 2014-12-02 MED ORDER — ARTIFICIAL TEARS OP OINT
TOPICAL_OINTMENT | OPHTHALMIC | Status: DC | PRN
  Administered 2014-12-02: 1 via OPHTHALMIC

## 2014-12-02 MED ORDER — PROPOFOL 10 MG/ML IV EMUL
0.0000 ug/kg/min | INTRAVENOUS | Status: DC
  Administered 2014-12-02: 30 ug/kg/min via INTRAVENOUS

## 2014-12-02 MED ORDER — MIDAZOLAM HCL 5 MG/5ML IJ SOLN
INTRAMUSCULAR | Status: DC | PRN
  Administered 2014-12-02: 2 mg via INTRAVENOUS

## 2014-12-02 MED ORDER — MIDAZOLAM HCL 2 MG/2ML IJ SOLN
2.0000 mg | INTRAMUSCULAR | Status: DC | PRN
  Filled 2014-12-02: qty 2

## 2014-12-02 MED ORDER — THROMBIN 5000 UNITS EX SOLR
CUTANEOUS | Status: AC
  Filled 2014-12-02: qty 5000

## 2014-12-02 MED ORDER — ROCURONIUM BROMIDE 50 MG/5ML IV SOLN
INTRAVENOUS | Status: AC
  Filled 2014-12-02: qty 1

## 2014-12-02 MED ORDER — ONDANSETRON HCL 4 MG/2ML IJ SOLN
4.0000 mg | Freq: Four times a day (QID) | INTRAMUSCULAR | Status: DC | PRN
  Filled 2014-12-02: qty 2

## 2014-12-02 MED ORDER — HYDROMORPHONE HCL 1 MG/ML IJ SOLN
INTRAMUSCULAR | Status: DC
Start: 2014-12-02 — End: 2014-12-02
  Filled 2014-12-02: qty 1

## 2014-12-02 MED ORDER — ROCURONIUM BROMIDE 100 MG/10ML IV SOLN
INTRAVENOUS | Status: DC | PRN
  Administered 2014-12-02: 50 mg via INTRAVENOUS

## 2014-12-02 MED ORDER — LIDOCAINE HCL (PF) 1 % IJ SOLN
INTRAMUSCULAR | Status: AC
  Filled 2014-12-02: qty 30

## 2014-12-02 MED ORDER — GELATIN ABSORBABLE MT POWD
OROMUCOSAL | Status: DC | PRN
  Administered 2014-12-02 (×3): 4 mL via TOPICAL

## 2014-12-02 MED ORDER — FENTANYL CITRATE 0.05 MG/ML IJ SOLN
INTRAMUSCULAR | Status: DC | PRN
  Administered 2014-12-02 (×2): 100 ug via INTRAVENOUS
  Administered 2014-12-02 (×2): 50 ug via INTRAVENOUS
  Administered 2014-12-02 (×3): 100 ug via INTRAVENOUS

## 2014-12-02 MED ORDER — CETYLPYRIDINIUM CHLORIDE 0.05 % MT LIQD
7.0000 mL | Freq: Four times a day (QID) | OROMUCOSAL | Status: DC
  Administered 2014-12-02 – 2014-12-06 (×15): 7 mL via OROMUCOSAL

## 2014-12-02 MED ORDER — LIDOCAINE HCL (CARDIAC) 20 MG/ML IV SOLN
INTRAVENOUS | Status: DC | PRN
  Administered 2014-12-02: 60 mg via INTRAVENOUS

## 2014-12-02 MED ORDER — FENTANYL CITRATE 0.05 MG/ML IJ SOLN
INTRAMUSCULAR | Status: AC
  Administered 2014-12-02: 50 ug
  Filled 2014-12-02: qty 2

## 2014-12-02 MED ORDER — FENTANYL CITRATE 0.05 MG/ML IJ SOLN
25.0000 ug | Freq: Once | INTRAMUSCULAR | Status: AC
  Administered 2014-12-02: 25 ug via INTRAVENOUS

## 2014-12-02 MED ORDER — IOHEXOL 300 MG/ML  SOLN
100.0000 mL | Freq: Once | INTRAMUSCULAR | Status: AC | PRN
  Administered 2014-12-02: 100 mL via INTRAVENOUS

## 2014-12-02 MED ORDER — PANTOPRAZOLE SODIUM 40 MG IV SOLR
40.0000 mg | Freq: Every day | INTRAVENOUS | Status: DC
  Administered 2014-12-02 – 2014-12-06 (×5): 40 mg via INTRAVENOUS
  Filled 2014-12-02 (×9): qty 40

## 2014-12-02 MED ORDER — ONDANSETRON HCL 4 MG/2ML IJ SOLN
INTRAMUSCULAR | Status: AC
  Filled 2014-12-02: qty 2

## 2014-12-02 MED ORDER — MIDAZOLAM HCL 2 MG/2ML IJ SOLN
4.0000 mg | Freq: Once | INTRAMUSCULAR | Status: AC
  Administered 2014-12-02: 4 mg via INTRAVENOUS

## 2014-12-02 MED ORDER — ETOMIDATE 2 MG/ML IV SOLN
INTRAVENOUS | Status: DC | PRN
  Administered 2014-12-02: 14 mg via INTRAVENOUS

## 2014-12-02 MED ORDER — ONDANSETRON HCL 4 MG PO TABS
4.0000 mg | ORAL_TABLET | Freq: Four times a day (QID) | ORAL | Status: DC | PRN
  Administered 2014-12-09 – 2014-12-11 (×2): 4 mg via ORAL
  Filled 2014-12-02 (×2): qty 1

## 2014-12-02 MED ORDER — SODIUM CHLORIDE 0.9 % IV SOLN
INTRAVENOUS | Status: AC | PRN
  Administered 2014-12-02 (×2): 1000 mL via INTRAVENOUS

## 2014-12-02 MED ORDER — PROPOFOL 10 MG/ML IV EMUL
INTRAVENOUS | Status: AC
  Administered 2014-12-02: 50 ug/kg/min via INTRAVENOUS
  Filled 2014-12-02: qty 100

## 2014-12-02 MED ORDER — MIDAZOLAM HCL 2 MG/2ML IJ SOLN
INTRAMUSCULAR | Status: AC
  Administered 2014-12-02: 4 mg via INTRAVENOUS
  Filled 2014-12-02: qty 4

## 2014-12-02 MED ORDER — DEXMEDETOMIDINE HCL IN NACL 200 MCG/50ML IV SOLN
INTRAVENOUS | Status: AC
  Filled 2014-12-02: qty 50

## 2014-12-02 MED ORDER — FENTANYL CITRATE 0.05 MG/ML IJ SOLN
100.0000 ug | INTRAMUSCULAR | Status: DC | PRN
  Administered 2014-12-02 – 2014-12-05 (×16): 100 ug via INTRAVENOUS
  Administered 2014-12-05: 50 ug via INTRAVENOUS
  Filled 2014-12-02 (×16): qty 2

## 2014-12-02 MED ORDER — MIDAZOLAM HCL 2 MG/2ML IJ SOLN
2.0000 mg | INTRAMUSCULAR | Status: DC | PRN
  Administered 2014-12-05: 2 mg via INTRAVENOUS
  Filled 2014-12-02: qty 2

## 2014-12-02 MED ORDER — MIDAZOLAM HCL 2 MG/2ML IJ SOLN
INTRAMUSCULAR | Status: AC
  Filled 2014-12-02: qty 2

## 2014-12-02 MED ORDER — VECURONIUM BROMIDE 10 MG IV SOLR
INTRAVENOUS | Status: DC | PRN
  Administered 2014-12-02: 5 mg via INTRAVENOUS

## 2014-12-02 MED ORDER — DEXMEDETOMIDINE HCL IN NACL 200 MCG/50ML IV SOLN
INTRAVENOUS | Status: DC | PRN
  Administered 2014-12-02: 0.3 ug/kg/h via INTRAVENOUS

## 2014-12-02 MED ORDER — FENTANYL CITRATE 0.05 MG/ML IJ SOLN
INTRAMUSCULAR | Status: AC | PRN
  Administered 2014-12-02 (×2): 50 ug via INTRAVENOUS

## 2014-12-02 MED ORDER — LACTATED RINGERS IV SOLN
INTRAVENOUS | Status: DC | PRN
  Administered 2014-12-02 (×3): via INTRAVENOUS

## 2014-12-02 MED ORDER — CHLORHEXIDINE GLUCONATE 0.12 % MT SOLN
15.0000 mL | Freq: Two times a day (BID) | OROMUCOSAL | Status: DC
  Administered 2014-12-02 – 2014-12-06 (×8): 15 mL via OROMUCOSAL
  Filled 2014-12-02 (×8): qty 15

## 2014-12-02 MED ORDER — PROPOFOL 10 MG/ML IV EMUL
0.0000 ug/kg/min | INTRAVENOUS | Status: DC
Start: 2014-12-02 — End: 2014-12-05
  Administered 2014-12-02 – 2014-12-04 (×11): 50 ug/kg/min via INTRAVENOUS
  Administered 2014-12-05: 45 ug/kg/min via INTRAVENOUS
  Administered 2014-12-05: 50 ug/kg/min via INTRAVENOUS
  Filled 2014-12-02 (×12): qty 100

## 2014-12-02 MED ORDER — PROPOFOL 10 MG/ML IV BOLUS
INTRAVENOUS | Status: AC
  Filled 2014-12-02: qty 20

## 2014-12-02 MED ORDER — PANTOPRAZOLE SODIUM 40 MG PO TBEC
40.0000 mg | DELAYED_RELEASE_TABLET | Freq: Every day | ORAL | Status: DC
  Administered 2014-12-07 – 2014-12-09 (×3): 40 mg via ORAL
  Filled 2014-12-02 (×3): qty 1

## 2014-12-02 SURGICAL SUPPLY — 133 items
ATTRACTOMAT 16X20 MAGNETIC DRP (DRAPES) ×4 IMPLANT
BAG DECANTER FOR FLEXI CONT (MISCELLANEOUS) ×4 IMPLANT
BANDAGE ELASTIC 4 VELCRO ST LF (GAUZE/BANDAGES/DRESSINGS) ×4 IMPLANT
BANDAGE ELASTIC 6 VELCRO ST LF (GAUZE/BANDAGES/DRESSINGS) ×4 IMPLANT
BASKET HEART (ORDER IN 25'S) (MISCELLANEOUS) ×1
BASKET HEART (ORDER IN 25S) (MISCELLANEOUS) ×3 IMPLANT
BLADE CORE FAN STRYKER (BLADE) ×4 IMPLANT
BLADE STERNUM SYSTEM 6 (BLADE) ×4 IMPLANT
BLADE SURG ROTATE 9660 (MISCELLANEOUS) IMPLANT
BNDG GAUZE ELAST 4 BULKY (GAUZE/BANDAGES/DRESSINGS) ×4 IMPLANT
CANISTER SUCTION 2500CC (MISCELLANEOUS) ×8 IMPLANT
CARDIAC SUCTION (MISCELLANEOUS) ×4 IMPLANT
CATH ROBINSON RED A/P 18FR (CATHETERS) ×8 IMPLANT
CATH THORACIC 28FR (CATHETERS) ×4 IMPLANT
CATH THORACIC 36FR (CATHETERS) ×8 IMPLANT
CATH THORACIC 36FR RT ANG (CATHETERS) ×8 IMPLANT
CHLORAPREP W/TINT 26ML (MISCELLANEOUS) ×4 IMPLANT
CLIP TI MEDIUM 24 (CLIP) IMPLANT
CLIP TI WIDE RED SMALL 24 (CLIP) IMPLANT
COVER MAYO STAND STRL (DRAPES) IMPLANT
COVER SURGICAL LIGHT HANDLE (MISCELLANEOUS) ×8 IMPLANT
CRADLE DONUT ADULT HEAD (MISCELLANEOUS) ×4 IMPLANT
DERMABOND ADHESIVE PROPEN (GAUZE/BANDAGES/DRESSINGS) ×2
DERMABOND ADVANCED .7 DNX6 (GAUZE/BANDAGES/DRESSINGS) ×6 IMPLANT
DRAPE CARDIOVASCULAR INCISE (DRAPES) ×1
DRAPE CV SPLIT W-CLR ANES SCRN (DRAPES) ×4 IMPLANT
DRAPE LAPAROSCOPIC ABDOMINAL (DRAPES) ×4 IMPLANT
DRAPE PERI GROIN 82X75IN TIB (DRAPES) ×4 IMPLANT
DRAPE PROXIMA HALF (DRAPES) IMPLANT
DRAPE SLUSH/WARMER DISC (DRAPES) ×4 IMPLANT
DRAPE SRG 135X102X78XABS (DRAPES) ×3 IMPLANT
DRAPE UTILITY XL STRL (DRAPES) ×8 IMPLANT
DRAPE WARM FLUID 44X44 (DRAPE) ×4 IMPLANT
DRSG COVADERM 4X14 (GAUZE/BANDAGES/DRESSINGS) ×4 IMPLANT
DRSG OPSITE POSTOP 4X10 (GAUZE/BANDAGES/DRESSINGS) IMPLANT
DRSG OPSITE POSTOP 4X8 (GAUZE/BANDAGES/DRESSINGS) IMPLANT
ELECT BLADE 4.0 EZ CLEAN MEGAD (MISCELLANEOUS) ×8
ELECT BLADE 6.5 EXT (BLADE) IMPLANT
ELECT CAUTERY BLADE 6.4 (BLADE) ×12 IMPLANT
ELECT REM PT RETURN 9FT ADLT (ELECTROSURGICAL) ×12
ELECTRODE BLDE 4.0 EZ CLN MEGD (MISCELLANEOUS) ×6 IMPLANT
ELECTRODE REM PT RTRN 9FT ADLT (ELECTROSURGICAL) ×9 IMPLANT
GAUZE SPONGE 4X4 12PLY STRL (GAUZE/BANDAGES/DRESSINGS) ×12 IMPLANT
GLOVE BIO SURGEON STRL SZ 6 (GLOVE) IMPLANT
GLOVE BIO SURGEON STRL SZ 6.5 (GLOVE) IMPLANT
GLOVE BIO SURGEON STRL SZ7 (GLOVE) ×4 IMPLANT
GLOVE BIO SURGEON STRL SZ7.5 (GLOVE) IMPLANT
GLOVE BIOGEL PI IND STRL 6 (GLOVE) IMPLANT
GLOVE BIOGEL PI IND STRL 6.5 (GLOVE) IMPLANT
GLOVE BIOGEL PI IND STRL 7.0 (GLOVE) IMPLANT
GLOVE BIOGEL PI IND STRL 7.5 (GLOVE) ×3 IMPLANT
GLOVE BIOGEL PI INDICATOR 6 (GLOVE)
GLOVE BIOGEL PI INDICATOR 6.5 (GLOVE)
GLOVE BIOGEL PI INDICATOR 7.0 (GLOVE)
GLOVE BIOGEL PI INDICATOR 7.5 (GLOVE) ×1
GLOVE EUDERMIC 7 POWDERFREE (GLOVE) ×8 IMPLANT
GLOVE ORTHO TXT STRL SZ7.5 (GLOVE) IMPLANT
GOWN STRL REUS W/ TWL LRG LVL3 (GOWN DISPOSABLE) ×21 IMPLANT
GOWN STRL REUS W/ TWL XL LVL3 (GOWN DISPOSABLE) ×3 IMPLANT
GOWN STRL REUS W/TWL LRG LVL3 (GOWN DISPOSABLE) ×7
GOWN STRL REUS W/TWL XL LVL3 (GOWN DISPOSABLE) ×1
HEMOSTAT POWDER SURGIFOAM 1G (HEMOSTASIS) ×12 IMPLANT
HEMOSTAT SURGICEL .5X2 ABSORB (HEMOSTASIS) ×8 IMPLANT
HEMOSTAT SURGICEL 2X14 (HEMOSTASIS) ×4 IMPLANT
INSERT FOGARTY 61MM (MISCELLANEOUS) IMPLANT
INSERT FOGARTY XLG (MISCELLANEOUS) IMPLANT
KIT BASIN OR (CUSTOM PROCEDURE TRAY) ×8 IMPLANT
KIT CATH CPB BARTLE (MISCELLANEOUS) ×4 IMPLANT
KIT ROOM TURNOVER OR (KITS) ×8 IMPLANT
KIT SUCTION CATH 14FR (SUCTIONS) ×8 IMPLANT
KIT VASOVIEW W/TROCAR VH 2000 (KITS) ×4 IMPLANT
LIGASURE IMPACT 36 18CM CVD LR (INSTRUMENTS) IMPLANT
NS IRRIG 1000ML POUR BTL (IV SOLUTION) ×28 IMPLANT
PACK GENERAL/GYN (CUSTOM PROCEDURE TRAY) ×4 IMPLANT
PACK OPEN HEART (CUSTOM PROCEDURE TRAY) ×4 IMPLANT
PAD ARMBOARD 7.5X6 YLW CONV (MISCELLANEOUS) ×12 IMPLANT
PAD ELECT DEFIB RADIOL ZOLL (MISCELLANEOUS) ×4 IMPLANT
PAD NEG PRESSURE SENSATRAC (MISCELLANEOUS) ×4 IMPLANT
PENCIL BUTTON HOLSTER BLD 10FT (ELECTRODE) ×4 IMPLANT
PUNCH AORTIC ROTATE 4.0MM (MISCELLANEOUS) IMPLANT
PUNCH AORTIC ROTATE 4.5MM 8IN (MISCELLANEOUS) ×4 IMPLANT
PUNCH AORTIC ROTATE 5MM 8IN (MISCELLANEOUS) IMPLANT
SPECIMEN JAR LARGE (MISCELLANEOUS) IMPLANT
SPONGE ABDOMINAL VAC ABTHERA (MISCELLANEOUS) ×4 IMPLANT
SPONGE INTESTINAL PEANUT (DISPOSABLE) IMPLANT
SPONGE LAP 18X18 X RAY DECT (DISPOSABLE) ×8 IMPLANT
SPONGE LAP 4X18 X RAY DECT (DISPOSABLE) ×4 IMPLANT
STAPLER VISISTAT 35W (STAPLE) ×8 IMPLANT
SUCTION POOLE TIP (SUCTIONS) ×4 IMPLANT
SUT BONE WAX W31G (SUTURE) ×4 IMPLANT
SUT MNCRL AB 4-0 PS2 18 (SUTURE) IMPLANT
SUT PDS AB 1 TP1 96 (SUTURE) ×8 IMPLANT
SUT PROLENE 3 0 SH DA (SUTURE) ×4 IMPLANT
SUT PROLENE 3 0 SH1 36 (SUTURE) ×4 IMPLANT
SUT PROLENE 4 0 RB 1 (SUTURE)
SUT PROLENE 4 0 SH DA (SUTURE) ×16 IMPLANT
SUT PROLENE 4-0 RB1 .5 CRCL 36 (SUTURE) IMPLANT
SUT PROLENE 5 0 C 1 36 (SUTURE) IMPLANT
SUT PROLENE 6 0 C 1 30 (SUTURE) IMPLANT
SUT PROLENE 7 0 BV 1 (SUTURE) IMPLANT
SUT PROLENE 7 0 BV1 MDA (SUTURE) ×4 IMPLANT
SUT PROLENE 8 0 BV175 6 (SUTURE) IMPLANT
SUT SILK  1 MH (SUTURE)
SUT SILK 1 MH (SUTURE) IMPLANT
SUT SILK 2 0 (SUTURE) ×1
SUT SILK 2 0 SH CR/8 (SUTURE) ×8 IMPLANT
SUT SILK 2-0 18XBRD TIE 12 (SUTURE) ×3 IMPLANT
SUT SILK 3 0 (SUTURE) ×1
SUT SILK 3 0 SH CR/8 (SUTURE) ×4 IMPLANT
SUT SILK 3-0 18XBRD TIE 12 (SUTURE) ×3 IMPLANT
SUT STEEL STERNAL CCS#1 18IN (SUTURE) IMPLANT
SUT STEEL SZ 6 DBL 3X14 BALL (SUTURE) ×12 IMPLANT
SUT VIC AB 1 CTX 27 (SUTURE) ×12 IMPLANT
SUT VIC AB 1 CTX 36 (SUTURE) ×2
SUT VIC AB 1 CTX36XBRD ANBCTR (SUTURE) ×6 IMPLANT
SUT VIC AB 2-0 CT1 27 (SUTURE)
SUT VIC AB 2-0 CT1 TAPERPNT 27 (SUTURE) IMPLANT
SUT VIC AB 2-0 CTX 27 (SUTURE) ×8 IMPLANT
SUT VIC AB 3-0 SH 27 (SUTURE)
SUT VIC AB 3-0 SH 27X BRD (SUTURE) IMPLANT
SUT VIC AB 3-0 X1 27 (SUTURE) IMPLANT
SUT VICRYL 4-0 PS2 18IN ABS (SUTURE) IMPLANT
SUTURE E-PAK OPEN HEART (SUTURE) ×4 IMPLANT
SYSTEM SAHARA CHEST DRAIN ATS (WOUND CARE) ×4 IMPLANT
TOWEL OR 17X24 6PK STRL BLUE (TOWEL DISPOSABLE) ×4 IMPLANT
TOWEL OR 17X26 10 PK STRL BLUE (TOWEL DISPOSABLE) ×8 IMPLANT
TRAY FOLEY CATH 16FRSI W/METER (SET/KITS/TRAYS/PACK) IMPLANT
TRAY FOLEY IC TEMP SENS 16FR (CATHETERS) ×4 IMPLANT
TUBE CONNECTING 12X1/4 (SUCTIONS) IMPLANT
TUBING INSUFFLATION (TUBING) ×4 IMPLANT
UNDERPAD 30X30 INCONTINENT (UNDERPADS AND DIAPERS) ×4 IMPLANT
WATER STERILE IRR 1000ML POUR (IV SOLUTION) ×8 IMPLANT
YANKAUER SUCT BULB TIP NO VENT (SUCTIONS) IMPLANT

## 2014-12-02 NOTE — Progress Notes (Signed)
Pt having ST elevation, EKG done at bedside showing acute pericarditis.  Results called to Dr. Dwain SarnaWakefield and then to Dr. Laneta SimmersBartle.  No new orders at this time.  Will continue to monitor.  Roselie AwkwardShannon Zeniya Lapidus, RN

## 2014-12-02 NOTE — ED Notes (Signed)
1 unit FFP completed.
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
## 2014-12-02 NOTE — Progress Notes (Signed)
Tube holder placed on patient without any complications.
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
## 2014-12-02 NOTE — Transfer of Care (Signed)
Immediate Anesthesia Transfer of Care Note  Patient: Johnny George  Procedure(s) Performed: Procedure(s): EXPLORATORY LAPAROTOMY (N/A) MEDIAN STERNOTOMY, repair right atrial lacerations, drainage of right hemothorax (N/A)  Patient Location: PACU and SICU  Anesthesia Type:General  Level of Consciousness: sedated, unresponsive and Patient remains intubated per anesthesia plan  Airway & Oxygen Therapy: Patient remains intubated per anesthesia plan and Patient placed on Ventilator (see vital sign flow sheet for setting)  Post-op Assessment: Report given to PACU RN and Post -op Vital signs reviewed and stable  Post vital signs: Reviewed and stable  Complications: No apparent anesthesia complications

## 2014-12-02 NOTE — ED Notes (Signed)
[facility]
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
## 2014-12-02 NOTE — ED Notes (Signed)
Family to bedside, updated by Dr Dwain SarnaWakefield

## 2014-12-02 NOTE — ED Notes (Signed)
1st unit of blood finished, 1 unit of FFP hung RAC.

## 2014-12-02 NOTE — Anesthesia Preprocedure Evaluation (Addendum)
Anesthesia Evaluation  Patient identified by MRN, date of birth, ID band Patient awake    Reviewed: Allergy & Precautions, NPO status , Patient's Chart, lab work & pertinent test resultsPreop documentation limited or incomplete due to emergent nature of procedure.  Airway Mallampati: II  TM Distance: >3 FB Neck ROM: Full    Dental   Pulmonary          Cardiovascular     Neuro/Psych    GI/Hepatic   Endo/Other    Renal/GU      Musculoskeletal   Abdominal   Peds  Hematology   Anesthesia Other Findings   Reproductive/Obstetrics                            Anesthesia Physical Anesthesia Plan  ASA: V and emergent  Anesthesia Plan: General   Post-op Pain Management:    Induction: Intravenous and Rapid sequence  Airway Management Planned: Oral ETT  Additional Equipment: Arterial line, CVP and Ultrasound Guidance Line Placement  Intra-op Plan:   Post-operative Plan: Post-operative intubation/ventilation  Informed Consent: I have reviewed the patients History and Physical, chart, labs and discussed the procedure including the risks, benefits and alternatives for the proposed anesthesia with the patient or authorized representative who has indicated his/her understanding and acceptance.   Dental advisory given  Plan Discussed with: CRNA and Surgeon  Anesthesia Plan Comments:        Anesthesia Quick Evaluation
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
## 2014-12-02 NOTE — ED Notes (Signed)
Pt transported to CT with RN's and MD.'s
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
## 2014-12-02 NOTE — ED Notes (Signed)
[place] called for massive transfusion activation.

## 2014-12-02 NOTE — ED Notes (Signed)
2nd unit Blood to LAC.

## 2014-12-03 ENCOUNTER — Inpatient Hospital Stay (HOSPITAL_COMMUNITY): Payer: No Typology Code available for payment source

## 2014-12-03 LAB — BASIC METABOLIC PANEL
Anion gap: 12 (ref 5–15)
Anion gap: 12 (ref 5–15)
BUN: 9 mg/dL (ref 6–23)
BUN: 10 mg/dL (ref 6–23)
CALCIUM: 7.9 mg/dL — AB (ref 8.4–10.5)
CHLORIDE: 99 meq/L (ref 96–112)
CO2: 24 meq/L (ref 19–32)
CO2: 24 mEq/L (ref 19–32)
CREATININE: 1.05 mg/dL (ref 0.50–1.35)
Calcium: 8 mg/dL — ABNORMAL LOW (ref 8.4–10.5)
Chloride: 100 mEq/L (ref 96–112)
Creatinine, Ser: 1.09 mg/dL (ref 0.50–1.35)
GFR calc non Af Amer: >90 mL/min (ref >90)
GFR, EST NON AFRICAN AMERICAN: 86 mL/min — AB (ref >90)
GLUCOSE: 128 mg/dL — AB (ref 70–99)
Glucose, Bld: 140 mg/dL — ABNORMAL HIGH (ref 70–99)
POTASSIUM: 4.4 meq/L (ref 3.7–5.3)
Potassium: 4.3 mEq/L (ref 3.7–5.3)
Sodium: 135 mEq/L — ABNORMAL LOW (ref 137–147)
Sodium: 136 mEq/L — ABNORMAL LOW (ref 137–147)

## 2014-12-03 LAB — CBC
HCT: 33.7 % — ABNORMAL LOW (ref 39.0–52.0)
HCT: 28.7 % — ABNORMAL LOW (ref 39.0–52.0)
HEMOGLOBIN: 9.8 g/dL — AB (ref 13.0–17.0)
Hemoglobin: 11.1 g/dL — ABNORMAL LOW (ref 13.0–17.0)
MCH: 29 pg (ref 26.0–34.0)
MCH: 30.5 pg (ref 26.0–34.0)
MCHC: 32.9 g/dL (ref 30.0–36.0)
MCHC: 34.1 g/dL (ref 30.0–36.0)
MCV: 88 fL (ref 78.0–100.0)
MCV: 89.4 fL (ref 78.0–100.0)
Platelets: 285 10*3/uL (ref 150–400)
Platelets: 231 10*3/uL (ref 150–400)
RBC: 3.83 MIL/uL — ABNORMAL LOW (ref 4.22–5.81)
RBC: 3.21 MIL/uL — ABNORMAL LOW (ref 4.22–5.81)
RDW: 13.8 % (ref 11.5–15.5)
RDW: 13.6 % (ref 11.5–15.5)
WBC: 16.1 10*3/uL — AB (ref 4.0–10.5)
WBC: 14.7 10*3/uL — ABNORMAL HIGH (ref 4.0–10.5)

## 2014-12-03 MED ORDER — ALBUMIN HUMAN 5 % IV SOLN
25.0000 g | Freq: Once | INTRAVENOUS | Status: AC
  Administered 2014-12-03: 25 g via INTRAVENOUS
  Filled 2014-12-03: qty 500

## 2014-12-03 MED ORDER — ACETAMINOPHEN 160 MG/5ML PO SOLN
650.0000 mg | ORAL | Status: DC | PRN
Start: 2014-12-03 — End: 2014-12-15
  Administered 2014-12-03 – 2014-12-07 (×5): 650 mg via ORAL
  Filled 2014-12-03 (×6): qty 20.3

## 2014-12-03 MED ORDER — ACETAMINOPHEN 650 MG RE SUPP: 650.0000 mg | RECTAL | Status: DC | PRN

## 2014-12-03 MED ORDER — LACTATED RINGERS IV BOLUS (SEPSIS)
1000.0000 mL | Freq: Once | INTRAVENOUS | Status: AC
  Administered 2014-12-03: 1000 mL via INTRAVENOUS

## 2014-12-03 MED ORDER — ACETAMINOPHEN 160 MG/5ML PO SOLN: 650.0000 mg | ORAL | Status: DC | PRN

## 2014-12-03 MED ORDER — ACETAMINOPHEN 650 MG RE SUPP
650.0000 mg | RECTAL | Status: DC | PRN
  Filled 2014-12-03: qty 1

## 2014-12-03 MED FILL — Sodium Chloride IV Soln 0.9%: INTRAVENOUS | Qty: 2000 | Status: AC

## 2014-12-03 MED FILL — Heparin Sodium (Porcine) Inj 1000 Unit/ML: INTRAMUSCULAR | Qty: 30 | Status: AC

## 2014-12-03 NOTE — Progress Notes (Signed)
Pt temp 102.2.  Dr. Dwain SarnaWakefield notified, orders received.  Will continue to monitor.  Roselie AwkwardShannon Randie Tallarico, RN

## 2014-12-03 NOTE — Progress Notes (Signed)
1 Day Post-Op Procedure(s) (LRB): EXPLORATORY LAPAROTOMY (N/A) MEDIAN STERNOTOMY, repair right atrial lacerations, drainage of right hemothorax (N/A) Subjective:  Sedated on vent. No problems overnight  Objective: Vital signs in last 24 hours: Temp:  [95.4 F (35.2 C)-102.2 F (39 C)] 99.1 F (37.3 C) (12/10 0600) Pulse Rate:  [55-98] 98 (12/10 0600) Cardiac Rhythm:  [-] Normal sinus rhythm (12/10 0000) Resp:  [13-41] 18 (12/10 0600) BP: (93-150)/(42-118) 93/48 mmHg (12/10 0600) SpO2:  [97 %-100 %] 98 % (12/10 0600) Arterial Line BP: (96-124)/(48-60) 107/48 mmHg (12/10 0300) FiO2 (%):  [30 %-40 %] 30 % (12/10 0315) Weight:  [78.8 kg (173 lb 11.6 oz)-81.647 kg (180 lb)] 78.8 kg (173 lb 11.6 oz) (12/10 0300)  Hemodynamic parameters for last 24 hours:    Intake/Output from previous day: 12/09 0701 - 12/10 0700 In: 5742.5 [I.V.:5742.5] Out: 4055 [Urine:1635; Drains:400; Blood:1000; Chest Tube:1020] Intake/Output this shift:    Heart: regular rate and rhythm, S1, S2 normal, no murmur, click, rub or gallop Lungs: clear to auscultation bilaterally Abdomen: vac in place  Lab Results:  Recent Labs  12/02/14 1800 12/03/14 0410  WBC 19.3* 16.1*  HGB 11.3* 11.1*  HCT 33.8* 33.7*  PLT 290 285   BMET:  Recent Labs  12/02/14 1800 12/03/14 0410  NA 139 135*  K 4.4 4.3  CL 105 99  CO2 23 24  GLUCOSE 177* 140*  BUN 8 9  CREATININE 0.94 1.05  CALCIUM 7.9* 8.0*    PT/INR:  Recent Labs  12/02/14 1800  LABPROT 15.3*  INR 1.20   ABG    Component Value Date/Time   PHART 7.376 12/02/2014 2012   HCO3 22.8 12/02/2014 2012   TCO2 24 12/02/2014 2012   ACIDBASEDEF 2.0 12/02/2014 2012   O2SAT 99.0 12/02/2014 2012   CBG (last 3)  No results for input(s): GLUCAP in the last 72 hours.  CXR: clear  Assessment/Plan: S/P Procedure(s) (LRB): EXPLORATORY LAPAROTOMY (N/A) MEDIAN STERNOTOMY, repair right atrial lacerations, drainage of right hemothorax (N/A)  Continue  sedation and ventilation with plan to return to OR tomorrow for pack removal by trauma.  Continue chest tubes to suction.    LOS: 1 day    Johnny George K 12/03/2014

## 2014-12-03 NOTE — Anesthesia Postprocedure Evaluation (Signed)
  Anesthesia Post-op Note  Patient: Johnny George  Procedure(s) Performed: Procedure(s): EXPLORATORY LAPAROTOMY (N/A) MEDIAN STERNOTOMY, repair right atrial lacerations, drainage of right hemothorax (N/A)  Patient Location: PACU and SICU  Anesthesia Type:General  Level of Consciousness: Patient remains intubated per anesthesia plan  Airway and Oxygen Therapy: Patient remains intubated per anesthesia plan  Post-op Pain: mild  Post-op Assessment: Post-op Vital signs reviewed  Post-op Vital Signs: Reviewed  Last Vitals:  Filed Vitals:   12/03/14 1556  BP:   Pulse:   Temp: 37.8 C  Resp:     Complications: No apparent anesthesia complications

## 2014-12-03 NOTE — Progress Notes (Signed)
UR completed.  Carlise Stofer, RN BSN [facility] CCM Trauma/Neuro ICU Case Manager [phone]  
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
## 2014-12-04 ENCOUNTER — Inpatient Hospital Stay (HOSPITAL_COMMUNITY): Payer: No Typology Code available for payment source

## 2014-12-04 ENCOUNTER — Inpatient Hospital Stay (HOSPITAL_COMMUNITY): Payer: No Typology Code available for payment source | Admitting: Certified Registered"

## 2014-12-04 ENCOUNTER — Encounter (HOSPITAL_COMMUNITY): Payer: Self-pay | Admitting: Certified Registered"

## 2014-12-04 ENCOUNTER — Encounter (HOSPITAL_COMMUNITY): Admission: EM | Disposition: A | Discharge status: Home Health | Payer: Self-pay | Source: Home / Self Care

## 2014-12-04 HISTORY — PX: LAPAROTOMY: SHX154

## 2014-12-04 LAB — PREPARE FRESH FROZEN PLASMA
UNIT DIVISION: 0
UNIT DIVISION: 0
UNIT DIVISION: 0
UNIT DIVISION: 0
UNIT DIVISION: 0
UNIT DIVISION: 0
Unit division: 0
Unit division: 0
Unit division: 0
Unit division: 0
Unit division: 0
Unit division: 0
Unit division: 0
Unit division: 0
Unit division: 0
Unit division: 0
Unit division: 0

## 2014-12-04 LAB — POCT I-STAT 7, (LYTES, BLD GAS, ICA,H+H)
BICARBONATE: 24.5 meq/L — AB (ref 20.0–24.0)
CALCIUM ION: 1.13 mmol/L (ref 1.12–1.23)
HCT: 25 % — ABNORMAL LOW (ref 39.0–52.0)
Hemoglobin: 8.5 g/dL — ABNORMAL LOW (ref 13.0–17.0)
O2 SAT: 100 %
PCO2 ART: 42.1 mmHg (ref 35.0–45.0)
PH ART: 7.379 (ref 7.350–7.450)
PO2 ART: 184 mmHg — AB (ref 80.0–100.0)
Potassium: 3.6 meq/L — ABNORMAL LOW (ref 3.7–5.3)
Sodium: 136 meq/L — ABNORMAL LOW (ref 137–147)
TCO2: 26 mmol/L (ref 0–100)

## 2014-12-04 LAB — BASIC METABOLIC PANEL
Anion gap: 7 (ref 5–15)
BUN: 11 mg/dL (ref 6–23)
CHLORIDE: 104 meq/L (ref 96–112)
CO2: 28 meq/L (ref 19–32)
Calcium: 8.1 mg/dL — ABNORMAL LOW (ref 8.4–10.5)
Creatinine, Ser: 1.02 mg/dL (ref 0.50–1.35)
GFR calc Af Amer: >90 mL/min (ref >90)
GFR calc non Af Amer: >90 mL/min (ref >90)
Glucose, Bld: 133 mg/dL — ABNORMAL HIGH (ref 70–99)
POTASSIUM: 4.1 meq/L (ref 3.7–5.3)
SODIUM: 139 meq/L (ref 137–147)

## 2014-12-04 LAB — CBC
HCT: 26.4 % — ABNORMAL LOW (ref 39.0–52.0)
HEMOGLOBIN: 8.7 g/dL — AB (ref 13.0–17.0)
MCH: 29.1 pg (ref 26.0–34.0)
MCHC: 33 g/dL (ref 30.0–36.0)
MCV: 88.3 fL (ref 78.0–100.0)
Platelets: 240 10*3/uL (ref 150–400)
RBC: 2.99 MIL/uL — AB (ref 4.22–5.81)
RDW: 13.5 % (ref 11.5–15.5)
WBC: 16.6 10*3/uL — ABNORMAL HIGH (ref 4.0–10.5)

## 2014-12-04 SURGERY — LAPAROTOMY, EXPLORATORY
Anesthesia: General

## 2014-12-04 MED ORDER — 0.9 % SODIUM CHLORIDE (POUR BTL) OPTIME
TOPICAL | Status: DC | PRN
  Administered 2014-12-04 (×4): 1000 mL

## 2014-12-04 MED ORDER — DEXMEDETOMIDINE HCL IN NACL 200 MCG/50ML IV SOLN
INTRAVENOUS | Status: AC
  Filled 2014-12-04: qty 50

## 2014-12-04 MED ORDER — LACTATED RINGERS IV SOLN
INTRAVENOUS | Status: DC | PRN
  Administered 2014-12-04 (×2): via INTRAVENOUS

## 2014-12-04 MED ORDER — MIDAZOLAM HCL 5 MG/5ML IJ SOLN
INTRAMUSCULAR | Status: DC | PRN
  Administered 2014-12-04: 2 mg via INTRAVENOUS

## 2014-12-04 MED ORDER — FENTANYL CITRATE 0.05 MG/ML IJ SOLN
INTRAMUSCULAR | Status: DC | PRN
  Administered 2014-12-04: 100 ug via INTRAVENOUS
  Administered 2014-12-04 (×4): 50 ug via INTRAVENOUS

## 2014-12-04 MED ORDER — PROPOFOL 10 MG/ML IV BOLUS
INTRAVENOUS | Status: AC
  Filled 2014-12-04: qty 20

## 2014-12-04 MED ORDER — PNEUMOCOCCAL VAC POLYVALENT 25 MCG/0.5ML IJ INJ: 0.5000 mL | INJECTION | INTRAMUSCULAR | Status: DC | PRN

## 2014-12-04 MED ORDER — CEFAZOLIN SODIUM-DEXTROSE 2-3 GM-% IV SOLR
INTRAVENOUS | Status: AC
  Filled 2014-12-04: qty 50

## 2014-12-04 MED ORDER — CEFAZOLIN SODIUM-DEXTROSE 2-3 GM-% IV SOLR
INTRAVENOUS | Status: DC | PRN
  Administered 2014-12-04: 2 g via INTRAVENOUS

## 2014-12-04 MED ORDER — ROCURONIUM BROMIDE 100 MG/10ML IV SOLN
INTRAVENOUS | Status: DC | PRN
  Administered 2014-12-04 (×2): 50 mg via INTRAVENOUS

## 2014-12-04 MED ORDER — PHENYLEPHRINE HCL 10 MG/ML IJ SOLN
INTRAMUSCULAR | Status: DC | PRN
  Administered 2014-12-04: 80 ug via INTRAVENOUS

## 2014-12-04 MED ORDER — FENTANYL CITRATE 0.05 MG/ML IJ SOLN
INTRAMUSCULAR | Status: AC
Start: 2014-12-04 — End: 2014-12-04
  Filled 2014-12-04: qty 5

## 2014-12-04 MED ORDER — FENTANYL CITRATE 0.05 MG/ML IJ SOLN
INTRAMUSCULAR | Status: AC
  Filled 2014-12-04: qty 5

## 2014-12-04 MED ORDER — MIDAZOLAM HCL 2 MG/2ML IJ SOLN
INTRAMUSCULAR | Status: AC
  Filled 2014-12-04: qty 2

## 2014-12-04 SURGICAL SUPPLY — 45 items
BLADE SURG ROTATE 9660 (MISCELLANEOUS) IMPLANT
CANISTER SUCTION 2500CC (MISCELLANEOUS) ×3 IMPLANT
CHLORAPREP W/TINT 26ML (MISCELLANEOUS) ×3 IMPLANT
COVER MAYO STAND STRL (DRAPES) IMPLANT
COVER SURGICAL LIGHT HANDLE (MISCELLANEOUS) ×3 IMPLANT
DRAPE LAPAROSCOPIC ABDOMINAL (DRAPES) ×3 IMPLANT
DRAPE PROXIMA HALF (DRAPES) IMPLANT
DRAPE UTILITY XL STRL (DRAPES) ×6 IMPLANT
DRAPE WARM FLUID 44X44 (DRAPE) ×3 IMPLANT
DRSG OPSITE POSTOP 4X10 (GAUZE/BANDAGES/DRESSINGS) IMPLANT
DRSG OPSITE POSTOP 4X8 (GAUZE/BANDAGES/DRESSINGS) IMPLANT
ELECT BLADE 6.5 EXT (BLADE) IMPLANT
ELECT CAUTERY BLADE 6.4 (BLADE) ×6 IMPLANT
ELECT REM PT RETURN 9FT ADLT (ELECTROSURGICAL) ×3
ELECTRODE REM PT RTRN 9FT ADLT (ELECTROSURGICAL) ×1 IMPLANT
GLOVE BIO SURGEON STRL SZ8 (GLOVE) ×3 IMPLANT
GLOVE BIOGEL PI IND STRL 8 (GLOVE) ×1 IMPLANT
GLOVE BIOGEL PI INDICATOR 8 (GLOVE) ×2
GOWN STRL REUS W/ TWL LRG LVL3 (GOWN DISPOSABLE) ×2 IMPLANT
GOWN STRL REUS W/ TWL XL LVL3 (GOWN DISPOSABLE) ×1 IMPLANT
GOWN STRL REUS W/TWL LRG LVL3 (GOWN DISPOSABLE) ×4
GOWN STRL REUS W/TWL XL LVL3 (GOWN DISPOSABLE) ×2
KIT BASIN OR (CUSTOM PROCEDURE TRAY) ×3 IMPLANT
KIT ROOM TURNOVER OR (KITS) ×3 IMPLANT
LIGASURE IMPACT 36 18CM CVD LR (INSTRUMENTS) IMPLANT
NS IRRIG 1000ML POUR BTL (IV SOLUTION) ×6 IMPLANT
PACK GENERAL/GYN (CUSTOM PROCEDURE TRAY) ×3 IMPLANT
PAD ARMBOARD 7.5X6 YLW CONV (MISCELLANEOUS) ×3 IMPLANT
PENCIL BUTTON HOLSTER BLD 10FT (ELECTRODE) IMPLANT
SPECIMEN JAR LARGE (MISCELLANEOUS) IMPLANT
SPONGE GAUZE 4X4 12PLY STER LF (GAUZE/BANDAGES/DRESSINGS) ×3 IMPLANT
SPONGE LAP 18X18 X RAY DECT (DISPOSABLE) IMPLANT
STAPLER VISISTAT 35W (STAPLE) ×3 IMPLANT
SUCTION POOLE TIP (SUCTIONS) ×3 IMPLANT
SUT PDS AB 1 TP1 96 (SUTURE) ×6 IMPLANT
SUT SILK 2 0 SH CR/8 (SUTURE) ×3 IMPLANT
SUT SILK 2 0 TIES 10X30 (SUTURE) ×3 IMPLANT
SUT SILK 3 0 SH CR/8 (SUTURE) ×3 IMPLANT
SUT SILK 3 0 TIES 10X30 (SUTURE) ×3 IMPLANT
TAPE CLOTH SURG 6X10 WHT LF (GAUZE/BANDAGES/DRESSINGS) ×3 IMPLANT
TOWEL OR 17X26 10 PK STRL BLUE (TOWEL DISPOSABLE) ×3 IMPLANT
TRAY FOLEY CATH 16FRSI W/METER (SET/KITS/TRAYS/PACK) IMPLANT
TUBE CONNECTING 12'X1/4 (SUCTIONS)
TUBE CONNECTING 12X1/4 (SUCTIONS) IMPLANT
YANKAUER SUCT BULB TIP NO VENT (SUCTIONS) IMPLANT
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
## 2014-12-04 NOTE — Transfer of Care (Signed)
Immediate Anesthesia Transfer of Care Note  Patient: Johnny George  Procedure(s) Performed: Procedure(s): EXPLORATORY LAPAROTOMY POSSIBLE CLOSURE OF ABDOMINAL WOUND. (N/A)  Patient Location: SICU  Anesthesia Type:General  Level of Consciousness: Patient remains intubated per anesthesia plan  Airway & Oxygen Therapy: Patient remains intubated per anesthesia plan and Patient placed on Ventilator (see vital sign flow sheet for setting)  Post-op Assessment: Report given to PACU RN and Post -op Vital signs reviewed and stable  Post vital signs: Reviewed and stable  Complications: No apparent anesthesia complications
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
## 2014-12-05 LAB — CBC WITH DIFFERENTIAL/PLATELET
BASOS PCT: 0 % (ref 0–1)
Basophils Absolute: 0 10*3/uL (ref 0.0–0.1)
Eosinophils Absolute: 0 10*3/uL (ref 0.0–0.7)
Eosinophils Relative: 0 % (ref 0–5)
HCT: 21 % — ABNORMAL LOW (ref 39.0–52.0)
HEMOGLOBIN: 7 g/dL — AB (ref 13.0–17.0)
LYMPHS PCT: 8 % — AB (ref 12–46)
Lymphs Abs: 1.1 10*3/uL (ref 0.7–4.0)
MCH: 30.6 pg (ref 26.0–34.0)
MCHC: 33.3 g/dL (ref 30.0–36.0)
MCV: 91.7 fL (ref 78.0–100.0)
MONOS PCT: 7 % (ref 3–12)
Monocytes Absolute: 0.9 10*3/uL (ref 0.1–1.0)
NEUTROS ABS: 11.1 10*3/uL — AB (ref 1.7–7.7)
NEUTROS PCT: 85 % — AB (ref 43–77)
Platelets: 238 10*3/uL (ref 150–400)
RBC: 2.29 MIL/uL — AB (ref 4.22–5.81)
RDW: 13.4 % (ref 11.5–15.5)
WBC: 13.1 10*3/uL — AB (ref 4.0–10.5)

## 2014-12-05 LAB — BASIC METABOLIC PANEL
Anion gap: 8 (ref 5–15)
BUN: 14 mg/dL (ref 6–23)
CHLORIDE: 105 meq/L (ref 96–112)
CO2: 26 meq/L (ref 19–32)
Calcium: 7.6 mg/dL — ABNORMAL LOW (ref 8.4–10.5)
Creatinine, Ser: 1.02 mg/dL (ref 0.50–1.35)
GFR calc non Af Amer: >90 mL/min (ref >90)
Glucose, Bld: 126 mg/dL — ABNORMAL HIGH (ref 70–99)
POTASSIUM: 3.8 meq/L (ref 3.7–5.3)
Sodium: 139 mEq/L (ref 137–147)

## 2014-12-05 MED ORDER — HYDROMORPHONE HCL 1 MG/ML IJ SOLN
1.0000 mg | INTRAMUSCULAR | Status: DC | PRN
  Administered 2014-12-05 (×4): 1 mg via INTRAVENOUS
  Administered 2014-12-06 (×6): 2 mg via INTRAVENOUS
  Administered 2014-12-06: 1 mg via INTRAVENOUS
  Administered 2014-12-06 – 2014-12-07 (×6): 2 mg via INTRAVENOUS
  Administered 2014-12-07: 1 mg via INTRAVENOUS
  Administered 2014-12-07 (×6): 2 mg via INTRAVENOUS
  Administered 2014-12-08: 1 mg via INTRAVENOUS
  Administered 2014-12-08 (×2): 2 mg via INTRAVENOUS
  Filled 2014-12-05: qty 2
  Filled 2014-12-05 (×3): qty 1
  Filled 2014-12-05 (×3): qty 2
  Filled 2014-12-05: qty 1
  Filled 2014-12-05 (×2): qty 2
  Filled 2014-12-05 (×2): qty 1
  Filled 2014-12-05 (×6): qty 2
  Filled 2014-12-05: qty 1
  Filled 2014-12-05 (×8): qty 2

## 2014-12-05 MED ORDER — SALINE SPRAY 0.65 % NA SOLN
1.0000 | NASAL | Status: DC | PRN
  Filled 2014-12-05: qty 44

## 2014-12-05 MED ORDER — SODIUM CHLORIDE 0.9 % IV SOLN
Freq: Once | INTRAVENOUS | Status: AC
  Administered 2014-12-05: 13:00:00 via INTRAVENOUS

## 2014-12-05 NOTE — Progress Notes (Signed)
1 Day Post-Op Procedure(s) (LRB): EXPLORATORY LAPAROTOMY POSSIBLE CLOSURE OF ABDOMINAL WOUND. (N/A) Subjective:  remains on vent butb cxr clear No air leak Will get MT tubes out  Objective: Vital signs in last 24 hours: Temp:  [98.2 F (36.8 C)-101.1 F (38.4 C)] 101.1 F (38.4 C) (12/12 0744) Pulse Rate:  [77-104] 95 (12/12 0845) Cardiac Rhythm:  [-] Sinus tachycardia (12/12 0800) Resp:  [18-28] 23 (12/12 0845) BP: (81-124)/(42-72) 124/64 mmHg (12/12 0800) SpO2:  [89 %-100 %] 93 % (12/12 0845) FiO2 (%):  [30 %] 30 % (12/12 0800) Weight:  [171 lb 11.8 oz (77.9 kg)] 171 lb 11.8 oz (77.9 kg) (12/12 0300)  Hemodynamic parameters for last 24 hours:  nsr  Intake/Output from previous day: 12/11 0701 - 12/12 0700 In: 4189.6 [I.V.:4189.6] Out: 1790 [Urine:1520; Chest Tube:270] Intake/Output this shift:    Lungs clear  Lab Results:  Recent Labs  12/03/14 1200 12/04/14 0500 12/04/14 1153  WBC 14.7* 16.6*  --   HGB 9.8* 8.7* 8.5*  HCT 28.7* 26.4* 25.0*  PLT 231 240  --    BMET:  Recent Labs  12/03/14 1200 12/04/14 0500 12/04/14 1153  NA 136* 139 136*  K 4.4 4.1 3.6*  CL 100 104  --   CO2 24 28  --   GLUCOSE 128* 133*  --   BUN 10 11  --   CREATININE 1.09 1.02  --   CALCIUM 7.9* 8.1*  --     PT/INR:  Recent Labs  12/02/14 1800  LABPROT 15.3*  INR 1.20   ABG    Component Value Date/Time   PHART 7.379 12/04/2014 1153   HCO3 24.5* 12/04/2014 1153   TCO2 26 12/04/2014 1153   ACIDBASEDEF 2.0 12/02/2014 2012   O2SAT 100.0 12/04/2014 1153   CBG (last 3)  No results for input(s): GLUCAP in the last 72 hours.  Assessment/Plan: S/P Procedure(s) (LRB): EXPLORATORY LAPAROTOMY POSSIBLE CLOSURE OF ABDOMINAL WOUND. (N/A) DC MT drains CXR in am   LOS: 3 days    Johnny George,Johnny George 12/05/2014
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
## 2014-12-05 NOTE — Procedures (Signed)
Extubation Procedure Note  Patient Details:   Name: Johnny George DOB: 07/06/1980 MRN: [id number]   Airway Documentation:     Evaluation  O2 sats: stable throughout Complications: No apparent complications Patient did tolerate procedure well. Bilateral Breath Sounds: Rhonchi Suctioning: Airway Yes  Met all requirement for extubation 3l/min Airway [place] IS 740m  GRevonda Standard12/11/2014, 11:47 AM

## 2014-12-06 ENCOUNTER — Inpatient Hospital Stay (HOSPITAL_COMMUNITY): Payer: No Typology Code available for payment source

## 2014-12-06 LAB — TYPE AND SCREEN
ABO/RH(D): O POS
Antibody Screen: NEGATIVE
UNIT DIVISION: 0
UNIT DIVISION: 0
UNIT DIVISION: 0
UNIT DIVISION: 0
Unit division: 0
Unit division: 0
Unit division: 0
Unit division: 0
Unit division: 0
Unit division: 0
Unit division: 0
Unit division: 0
Unit division: 0
Unit division: 0

## 2014-12-06 LAB — CBC WITH DIFFERENTIAL/PLATELET
BASOS ABS: 0 10*3/uL (ref 0.0–0.1)
Basophils Relative: 0 % (ref 0–1)
EOS PCT: 0 % (ref 0–5)
Eosinophils Absolute: 0.1 10*3/uL (ref 0.0–0.7)
HCT: 25.1 % — ABNORMAL LOW (ref 39.0–52.0)
Hemoglobin: 8.2 g/dL — ABNORMAL LOW (ref 13.0–17.0)
Lymphocytes Relative: 9 % — ABNORMAL LOW (ref 12–46)
Lymphs Abs: 1.4 10*3/uL (ref 0.7–4.0)
MCH: 28.7 pg (ref 26.0–34.0)
MCHC: 32.7 g/dL (ref 30.0–36.0)
MCV: 87.8 fL (ref 78.0–100.0)
Monocytes Absolute: 1.4 10*3/uL — ABNORMAL HIGH (ref 0.1–1.0)
Monocytes Relative: 8 % (ref 3–12)
Neutro Abs: 13.9 10*3/uL — ABNORMAL HIGH (ref 1.7–7.7)
Neutrophils Relative %: 83 % — ABNORMAL HIGH (ref 43–77)
PLATELETS: 314 10*3/uL (ref 150–400)
RBC: 2.86 MIL/uL — ABNORMAL LOW (ref 4.22–5.81)
RDW: 13.7 % (ref 11.5–15.5)
WBC: 16.8 10*3/uL — ABNORMAL HIGH (ref 4.0–10.5)

## 2014-12-06 LAB — BASIC METABOLIC PANEL
Anion gap: 13 (ref 5–15)
BUN: 10 mg/dL (ref 6–23)
CALCIUM: 8 mg/dL — AB (ref 8.4–10.5)
CO2: 24 mEq/L (ref 19–32)
Chloride: 102 mEq/L (ref 96–112)
Creatinine, Ser: 0.79 mg/dL (ref 0.50–1.35)
GFR calc Af Amer: >90 mL/min (ref >90)
GLUCOSE: 106 mg/dL — AB (ref 70–99)
Potassium: 3.7 mEq/L (ref 3.7–5.3)
Sodium: 139 mEq/L (ref 137–147)

## 2014-12-06 MED ORDER — MIDAZOLAM HCL 2 MG/2ML IJ SOLN
1.0000 mg | Freq: Once | INTRAMUSCULAR | Status: AC
  Administered 2014-12-06: 1 mg via INTRAVENOUS
  Filled 2014-12-06: qty 2

## 2014-12-06 NOTE — Progress Notes (Signed)
CT surgery p.m. Rounds  Patient sitting up in chair resting comfortably Good O2 sats after removal of final chest tube today Sinus rhythm
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
## 2014-12-07 ENCOUNTER — Inpatient Hospital Stay (HOSPITAL_COMMUNITY): Payer: No Typology Code available for payment source

## 2014-12-07 ENCOUNTER — Encounter (HOSPITAL_COMMUNITY): Payer: Self-pay | Admitting: General Surgery

## 2014-12-07 LAB — GLUCOSE, CAPILLARY: GLUCOSE-CAPILLARY: 132 mg/dL — AB (ref 70–99)

## 2014-12-07 MED ORDER — DIVALPROEX SODIUM 250 MG PO DR TAB
250.0000 mg | DELAYED_RELEASE_TABLET | Freq: Two times a day (BID) | ORAL | Status: DC
  Filled 2014-12-07 (×19): qty 1

## 2014-12-07 MED ORDER — OXYCODONE-ACETAMINOPHEN 5-325 MG PO TABS
1.0000 | ORAL_TABLET | ORAL | Status: DC | PRN
  Administered 2014-12-07 – 2014-12-15 (×31): 2 via ORAL
  Filled 2014-12-07: qty 2
  Filled 2014-12-07: qty 1
  Filled 2014-12-07 (×19): qty 2
  Filled 2014-12-07: qty 1
  Filled 2014-12-07 (×8): qty 2
  Filled 2014-12-07: qty 1
  Filled 2014-12-07 (×3): qty 2

## 2014-12-07 MED ORDER — ENSURE COMPLETE PO LIQD
237.0000 mL | Freq: Two times a day (BID) | ORAL | Status: DC
  Administered 2014-12-07 – 2014-12-10 (×5): 237 mL via ORAL

## 2014-12-07 MED ORDER — FLUOXETINE HCL 20 MG PO CAPS
20.0000 mg | ORAL_CAPSULE | Freq: Every day | ORAL | Status: DC
  Administered 2014-12-11: 20 mg via ORAL
  Filled 2014-12-07 (×9): qty 1
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
## 2014-12-07 NOTE — Progress Notes (Signed)
3 Days Post-Op Procedure(s) (LRB): EXPLORATORY LAPAROTOMY POSSIBLE CLOSURE OF ABDOMINAL WOUND. (N/A) Subjective:  sore  Objective: Vital signs in last 24 hours: Temp:  [98.7 F (37.1 C)-100.5 F (38.1 C)] 100.5 F (38.1 C) (12/14 1700) Pulse Rate:  [95-127] 112 (12/14 1333) Cardiac Rhythm:  [-] Sinus tachycardia (12/14 1000) Resp:  [14-28] 22 (12/14 1333) BP: (122-145)/(52-81) 130/69 mmHg (12/14 1333) SpO2:  [90 %-98 %] 95 % (12/14 1333)  Hemodynamic parameters for last 24 hours:    Intake/Output from previous day: 12/13 0701 - 12/14 0700 In: 5220 [P.O.:2820; I.V.:2400] Out: 3160 [Urine:3100; Chest Tube:60] Intake/Output this shift: Total I/O In: 942.5 [P.O.:680; I.V.:262.5] Out: 1300 [Urine:1300]  General appearance: alert and cooperative Neurologic: intact Heart: regular rate and rhythm, S1, S2 normal, no murmur, click, rub or gallop Lungs: clear to auscultation bilaterally Sternotomy incision healing well  Lab Results:  Recent Labs  12/05/14 0942 12/06/14 0500  WBC 13.1* 16.8*  HGB 7.0* 8.2*  HCT 21.0* 25.1*  PLT 238 314   BMET:  Recent Labs  12/05/14 0942 12/06/14 0500  NA 139 139  George 3.8 3.7  CL 105 102  CO2 26 24  GLUCOSE 126* 106*  BUN 14 10  CREATININE 1.02 0.79  CALCIUM 7.6* 8.0*    PT/INR: No results for input(s): LABPROT, INR in the last 72 hours. ABG    Component Value Date/Time   PHART 7.379 12/04/2014 1153   HCO3 24.5* 12/04/2014 1153   TCO2 26 12/04/2014 1153   ACIDBASEDEF 2.0 12/02/2014 2012   O2SAT 100.0 12/04/2014 1153   CBG (last 3)  No results for input(s): GLUCAP in the last 72 hours.  Assessment/Plan:  Stable s/p median sternotomy and repair of right atrial lacerations. Continue IS, ambulation. Staples need to stay in 10 days.   LOS: 5 days    Johnny George 12/07/2014
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
## 2014-12-08 LAB — CBC
HCT: 27.4 % — ABNORMAL LOW (ref 39.0–52.0)
Hemoglobin: 8.9 g/dL — ABNORMAL LOW (ref 13.0–17.0)
MCH: 28.1 pg (ref 26.0–34.0)
MCHC: 32.5 g/dL (ref 30.0–36.0)
MCV: 86.4 fL (ref 78.0–100.0)
Platelets: 476 10*3/uL — ABNORMAL HIGH (ref 150–400)
RBC: 3.17 MIL/uL — ABNORMAL LOW (ref 4.22–5.81)
RDW: 13.3 % (ref 11.5–15.5)
WBC: 18.5 10*3/uL — ABNORMAL HIGH (ref 4.0–10.5)

## 2014-12-08 MED ORDER — BACITRACIN ZINC 500 UNIT/GM EX OINT
TOPICAL_OINTMENT | Freq: Two times a day (BID) | CUTANEOUS | Status: DC
  Administered 2014-12-08: 1 via TOPICAL
  Administered 2014-12-08 – 2014-12-09 (×2): 15.5556 via TOPICAL
  Administered 2014-12-09: 1 via TOPICAL
  Administered 2014-12-10: 23:00:00 via TOPICAL
  Administered 2014-12-10 – 2014-12-11 (×2): 15.5556 via TOPICAL
  Administered 2014-12-11 – 2014-12-13 (×4): via TOPICAL
  Administered 2014-12-13: 15.5556 via TOPICAL
  Administered 2014-12-14 – 2014-12-15 (×2): via TOPICAL
  Filled 2014-12-08: qty 28.35
  Filled 2014-12-08 (×2): qty 15

## 2014-12-08 MED ORDER — DOCUSATE SODIUM 100 MG PO CAPS
100.0000 mg | ORAL_CAPSULE | Freq: Two times a day (BID) | ORAL | Status: DC
  Administered 2014-12-08 – 2014-12-09 (×4): 100 mg via ORAL
  Filled 2014-12-08 (×4): qty 1

## 2014-12-08 MED ORDER — METHOCARBAMOL 500 MG PO TABS
1000.0000 mg | ORAL_TABLET | Freq: Three times a day (TID) | ORAL | Status: DC | PRN
  Administered 2014-12-08 – 2014-12-15 (×16): 1000 mg via ORAL
  Filled 2014-12-08 (×16): qty 2

## 2014-12-08 MED ORDER — ENOXAPARIN SODIUM 40 MG/0.4ML ~~LOC~~ SOLN
40.0000 mg | SUBCUTANEOUS | Status: DC
  Administered 2014-12-08 – 2014-12-15 (×8): 40 mg via SUBCUTANEOUS
  Filled 2014-12-08 (×9): qty 0.4

## 2014-12-08 MED ORDER — POLYETHYLENE GLYCOL 3350 17 G PO PACK
17.0000 g | PACK | Freq: Every day | ORAL | Status: DC
  Filled 2014-12-08 (×8): qty 1

## 2014-12-08 MED ORDER — IPRATROPIUM-ALBUTEROL 0.5-2.5 (3) MG/3ML IN SOLN
3.0000 mL | RESPIRATORY_TRACT | Status: DC
  Administered 2014-12-08 – 2014-12-09 (×4): 3 mL via RESPIRATORY_TRACT
  Filled 2014-12-08 (×4): qty 3
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
## 2014-12-08 NOTE — Progress Notes (Signed)
Per MD order, central line removed. IV cathter intact. Vaseline pressure gauze to site, pressure held x 5 min, no bleeding to site. Pt instructed not to get out of bed for 30 min after the removal of the central line. Instucted to keep dressing CDI x 24hours, if bleeding occurs hold pressure, and contact nurse.  Pt does not have any questions. Consuello Masseimmons, Mahmud Keithly M
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
## 2014-12-09 ENCOUNTER — Inpatient Hospital Stay (HOSPITAL_COMMUNITY): Payer: No Typology Code available for payment source

## 2014-12-09 LAB — CBC
HCT: 27 % — ABNORMAL LOW (ref 39.0–52.0)
Hemoglobin: 8.9 g/dL — ABNORMAL LOW (ref 13.0–17.0)
MCH: 28.3 pg (ref 26.0–34.0)
MCHC: 33 g/dL (ref 30.0–36.0)
MCV: 85.7 fL (ref 78.0–100.0)
PLATELETS: 622 10*3/uL — AB (ref 150–400)
RBC: 3.15 MIL/uL — ABNORMAL LOW (ref 4.22–5.81)
RDW: 13.3 % (ref 11.5–15.5)
WBC: 15.7 10*3/uL — AB (ref 4.0–10.5)

## 2014-12-09 MED ORDER — ALBUTEROL SULFATE (2.5 MG/3ML) 0.083% IN NEBU: 2.5000 mg | INHALATION_SOLUTION | RESPIRATORY_TRACT | Status: DC | PRN

## 2014-12-09 MED ORDER — IPRATROPIUM-ALBUTEROL 0.5-2.5 (3) MG/3ML IN SOLN
3.0000 mL | Freq: Two times a day (BID) | RESPIRATORY_TRACT | Status: DC
  Administered 2014-12-09: 3 mL via RESPIRATORY_TRACT
  Filled 2014-12-09: qty 3

## 2014-12-09 MED ORDER — IPRATROPIUM-ALBUTEROL 0.5-2.5 (3) MG/3ML IN SOLN: 3.0000 mL | Freq: Four times a day (QID) | RESPIRATORY_TRACT | Status: DC | PRN
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
## 2014-12-10 DIAGNOSIS — S2690XA Unspecified injury of heart, unspecified with or without hemopericardium, initial encounter: Secondary | ICD-10-CM | POA: Diagnosis present

## 2014-12-10 DIAGNOSIS — D62 Acute posthemorrhagic anemia: Secondary | ICD-10-CM | POA: Diagnosis not present

## 2014-12-10 DIAGNOSIS — S31139A Puncture wound of abdominal wall without foreign body, unspecified quadrant without penetration into peritoneal cavity, initial encounter: Secondary | ICD-10-CM

## 2014-12-10 DIAGNOSIS — J9600 Acute respiratory failure, unspecified whether with hypoxia or hypercapnia: Secondary | ICD-10-CM | POA: Diagnosis present

## 2014-12-10 DIAGNOSIS — W3400XA Accidental discharge from unspecified firearms or gun, initial encounter: Secondary | ICD-10-CM

## 2014-12-10 DIAGNOSIS — S36113A Laceration of liver, unspecified degree, initial encounter: Secondary | ICD-10-CM | POA: Diagnosis present

## 2014-12-10 LAB — CBC
HCT: 26.3 % — ABNORMAL LOW (ref 39.0–52.0)
Hemoglobin: 8.6 g/dL — ABNORMAL LOW (ref 13.0–17.0)
MCH: 28.3 pg (ref 26.0–34.0)
MCHC: 32.7 g/dL (ref 30.0–36.0)
MCV: 86.5 fL (ref 78.0–100.0)
Platelets: 710 10*3/uL — ABNORMAL HIGH (ref 150–400)
RBC: 3.04 MIL/uL — AB (ref 4.22–5.81)
RDW: 13.6 % (ref 11.5–15.5)
WBC: 14.9 10*3/uL — AB (ref 4.0–10.5)

## 2014-12-10 MED ORDER — DOCUSATE SODIUM 100 MG PO CAPS
200.0000 mg | ORAL_CAPSULE | Freq: Two times a day (BID) | ORAL | Status: DC
  Administered 2014-12-10 – 2014-12-13 (×7): 200 mg via ORAL
  Filled 2014-12-10 (×8): qty 2

## 2014-12-10 NOTE — Progress Notes (Addendum)
      [address]       [place] [id number]             [phone]      6 Days Post-Op Procedure(s) (LRB): EXPLORATORY LAPAROTOMY POSSIBLE CLOSURE OF ABDOMINAL WOUND. (N/A) Subjective: Feeling better  Objective: Vital signs in last 24 hours: Temp:  [98.4 F (36.9 C)-98.5 F (36.9 C)] 98.5 F (36.9 C) (12/17 0537) Pulse Rate:  [88-103] 88 (12/17 0537) Cardiac Rhythm:  [-] Normal sinus rhythm (12/17 0009) Resp:  [18-20] 20 (12/17 0537) BP: (107-127)/(64-73) 122/66 mmHg (12/17 0537) SpO2:  [96 %-100 %] 100 % (12/17 0537)  Hemodynamic parameters for last 24 hours:    Intake/Output from previous day: 12/16 0701 - 12/17 0700 In: 600 [P.O.:600] Out: 1150 [Urine:1150] Intake/Output this shift:    General appearance: alert, cooperative and no distress Heart: regular rate and rhythm Lungs: clear to auscultation bilaterally Abdomen: soft, mimot temderness Extremities: extremities normal, atraumatic, no cyanosis or edema Wound: incis healing well, scant drainage on abdominal incis dressing  Lab Results:  Recent Labs  12/09/14 1030 12/10/14 0500  WBC 15.7* 14.9*  HGB 8.9* 8.6*  HCT 27.0* 26.3*  PLT 622* 710*   BMET: No results for input(s): NA, K, CL, CO2, GLUCOSE, BUN, CREATININE, CALCIUM in the last 72 hours.  PT/INR: No results for input(s): LABPROT, INR in the last 72 hours. ABG    Component Value Date/Time   PHART 7.379 12/04/2014 1153   HCO3 24.5* 12/04/2014 1153   TCO2 26 12/04/2014 1153   ACIDBASEDEF 2.0 12/02/2014 2012   O2SAT 100.0 12/04/2014 1153   CBG (last 3)   Recent Labs  12/07/14 1944  GLUCAP 132*    Meds Scheduled Meds: . bacitracin   Topical BID  . divalproex  250 mg Oral Q12H  . docusate sodium  200 mg Oral BID  . enoxaparin (LOVENOX) injection  40 mg Subcutaneous Q24H  . feeding supplement (ENSURE COMPLETE)  237 mL Oral BID BM  . FLUoxetine  20 mg Oral QHS  . polyethylene glycol  17 g Oral Daily   Continuous  Infusions:  PRN Meds:.acetaminophen **OR** acetaminophen (TYLENOL) oral liquid 160 mg/5 mL, ipratropium-albuterol, methocarbamol, ondansetron **OR** ondansetron (ZOFRAN) IV, oxyCODONE-acetaminophen, pneumococcal 23 valent vaccine, sodium chloride  Xrays Dg Chest 2 View  12/09/2014   CLINICAL DATA:  Pleural effusion  EXAM: CHEST  2 VIEW  COMPARISON:  12/07/2014  FINDINGS: Small to moderate right pleural effusion, mildly increased. Associated right lower lobe opacity, likely atelectasis.  Small left pleural effusion, unchanged.  No frank interstitial edema.  No pneumothorax.  The heart is top-normal in size.  Median sternotomy with overlying midline skin staples.  Interval removal of left IJ venous catheter.  IMPRESSION: Small to moderate right pleural effusion, mildly increased. Associated right lower lobe opacity, likely atelectasis.  Small left pleural effusion, unchanged.   Electronically Signed   By: Charline BillsSriyesh  Krishnan M.D.   On: 12/09/2014 21:42    Assessment/Plan: S/P Procedure(s) (LRB): EXPLORATORY LAPAROTOMY POSSIBLE CLOSURE OF ABDOMINAL WOUND. (N/A)  1 steady progress 2 no fevers, leukocytosis improving 3 Cont to work on rehab/pulm toilet   LOS: 8 days    GOLD,WAYNE E 12/10/2014   Chart reviewed, patient examined, agree with above. Chest incision ok. Staples need to stay in 10 days.
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
## 2014-12-10 NOTE — Progress Notes (Signed)
PT Cancellation Note  Patient Details Name: Johnny George MRN: [id number] DOB: 07/06/1980   Cancelled Treatment:    Reason Eval/Treat Not Completed: Other (comment). Attempted to see patient however patient sitting on BSC attempting to have BM. Encourage patient to walk with nursing and PT will follow up in AM   Johnny George, Johnny George 12/10/2014, 2:50 PM

## 2014-12-11 ENCOUNTER — Inpatient Hospital Stay (HOSPITAL_COMMUNITY): Payer: No Typology Code available for payment source

## 2014-12-11 ENCOUNTER — Encounter (HOSPITAL_COMMUNITY): Payer: Self-pay | Admitting: *Deleted

## 2014-12-11 LAB — EXPECTORATED SPUTUM ASSESSMENT W GRAM STAIN, RFLX TO RESP C

## 2014-12-11 LAB — EXPECTORATED SPUTUM ASSESSMENT W REFEX TO RESP CULTURE

## 2014-12-11 MED ORDER — IOHEXOL 350 MG/ML SOLN
100.0000 mL | Freq: Once | INTRAVENOUS | Status: AC | PRN
  Administered 2014-12-11: 100 mL via INTRAVENOUS

## 2014-12-11 MED ORDER — IOHEXOL 300 MG/ML  SOLN
50.0000 mL | Freq: Once | INTRAMUSCULAR | Status: AC | PRN
  Administered 2014-12-11: 50 mL via ORAL

## 2014-12-11 MED ORDER — LIDOCAINE HCL (PF) 1 % IJ SOLN
INTRAMUSCULAR | Status: AC
  Filled 2014-12-11: qty 10
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
## 2014-12-12 ENCOUNTER — Inpatient Hospital Stay (HOSPITAL_COMMUNITY): Payer: No Typology Code available for payment source

## 2014-12-12 LAB — EXPECTORATED SPUTUM ASSESSMENT W REFEX TO RESP CULTURE

## 2014-12-12 LAB — EXPECTORATED SPUTUM ASSESSMENT W GRAM STAIN, RFLX TO RESP C

## 2014-12-12 NOTE — Progress Notes (Signed)
      [address]       [place] [id number]             [phone]       8 Days Post-Op Procedure(s) (LRB): EXPLORATORY LAPAROTOMY POSSIBLE CLOSURE OF ABDOMINAL WOUND. (N/A)  Subjective: Patient with incisional pain  Objective: Vital signs in last 24 hours: Temp:  [98.8 F (37.1 C)-99.5 F (37.5 C)] 99.5 F (37.5 C) (12/19 0452) Pulse Rate:  [100-112] 112 (12/19 0452) Cardiac Rhythm:  [-] Normal sinus rhythm (12/18 2115) Resp:  [18-19] 18 (12/19 0452) BP: (99-117)/(56-66) 104/61 mmHg (12/19 0452) SpO2:  [99 %-100 %] 99 % (12/19 0452)      Intake/Output from previous day: 12/18 0701 - 12/19 0700 In: 500 [P.O.:500] Out: 700 [Urine:700]   Physical Exam:  Cardiovascular: RRR Pulmonary: Clear to auscultation on left and diminished right base. Abdomen: Soft, non tender, bowel sounds present. Extremities: No lower extremity edema. Wounds: Sternal wound is clean and dry.  No erythema or signs of infection.   Lab Results: CBC: Recent Labs  12/10/14 0500  WBC 14.9*  HGB 8.6*  HCT 26.3*  PLT 710*   BMET: No results for input(s): NA, K, CL, CO2, GLUCOSE, BUN, CREATININE, CALCIUM in the last 72 hours.  PT/INR: No results for input(s): LABPROT, INR in the last 72 hours. ABG:  INR: Will add last result for INR, ABG once components are confirmed Will add last 4 CBG results once components are confirmed  Assessment/Plan:  1. CV - ST 2.  Pulmonary - s/p right thoracentesis yesterday (500 cc removed). CXR not taken yet today. 3. Management per trauma  ZIMMERMAN,DONIELLE MPA-C 12/12/2014,12:17 PM
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
## 2014-12-13 MED ORDER — GATORADE (BH): 240.0000 mL | Freq: Three times a day (TID) | ORAL | Status: DC

## 2014-12-13 MED ORDER — GUAIFENESIN ER 600 MG PO TB12
600.0000 mg | ORAL_TABLET | Freq: Two times a day (BID) | ORAL | Status: DC
  Administered 2014-12-13 – 2014-12-15 (×4): 600 mg via ORAL
  Filled 2014-12-13 (×6): qty 1

## 2014-12-13 NOTE — Plan of Care (Signed)
Problem: Phase I - Pre-Op Goal: Pain controlled with appropriate interventions Outcome: Completed/Met Date Met:  12/13/14 PO pain medication seems to control his pain well.
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
## 2014-12-13 NOTE — Plan of Care (Signed)
Problem: Phase II - Intermediate Post-Op Goal: Advance Diet Outcome: Progressing On regular diet; however, not eating very well.

## 2014-12-13 NOTE — Progress Notes (Signed)
      [address]       [place] [id number]             [phone]       9 Days Post-Op Procedure(s) (LRB): EXPLORATORY LAPAROTOMY POSSIBLE CLOSURE OF ABDOMINAL WOUND. (N/A)  Subjective: Patient with incisional pain and productive cough  Objective: Vital signs in last 24 hours: Temp:  [99.1 F (37.3 C)-99.3 F (37.4 C)] 99.1 F (37.3 C) (12/20 0523) Pulse Rate:  [96-102] 100 (12/20 0523) Cardiac Rhythm:  [-]  Resp:  [17-18] 18 (12/20 0523) BP: (100-108)/(50-66) 100/63 mmHg (12/20 0523) SpO2:  [99 %-100 %] 100 % (12/20 0523)      Intake/Output from previous day: 12/19 0701 - 12/20 0700 In: 960 [P.O.:960] Out: -    Physical Exam:  Cardiovascular: Slightly tachy Pulmonary: Clear to auscultation on right and diminished left base. Abdomen: Soft, non tender, bowel sounds present. Extremities: No lower extremity edema. Wounds: Sternal wound is clean and dry.  No erythema or signs of infection.   Lab Results: CBC:No results for input(s): WBC, HGB, HCT, PLT in the last 72 hours. BMET: No results for input(s): NA, K, CL, CO2, GLUCOSE, BUN, CREATININE, CALCIUM in the last 72 hours.  PT/INR: No results for input(s): LABPROT, INR in the last 72 hours. ABG:  INR: Will add last result for INR, ABG once components are confirmed Will add last 4 CBG results once components are confirmed  Assessment/Plan:  1. CV - Slightly tachy 2.  Pulmonary - s/p right thoracentesis yesterday (500 cc removed). Will order CXR for am. Encourage incentive spirometer and flutter valve. Mucinex bid for cough. 3. Management per trauma  ZIMMERMAN,DONIELLE MPA-C 12/13/2014,11:24 AM

## 2014-12-13 NOTE — Plan of Care (Signed)
Problem: Phase III - Recovery through Discharge Goal: Activity Progressed Outcome: Completed/Met Date Met:  12/13/14 Has walked in hallway and sat in recliner most of day today. Still diaphoretic most of the time. States he has night sweats every night.

## 2014-12-13 NOTE — Plan of Care (Signed)
Problem: Phase II - Intermediate Post-Op Goal: Pain controlled with appropriate interventions Outcome: Completed/Met Date Met:  12/13/14 Doing well on PO pain medications.

## 2014-12-14 ENCOUNTER — Inpatient Hospital Stay (HOSPITAL_COMMUNITY): Payer: No Typology Code available for payment source

## 2014-12-14 MED ORDER — MAGNESIUM HYDROXIDE 400 MG/5ML PO SUSP
30.0000 mL | Freq: Every day | ORAL | Status: DC
  Filled 2014-12-14: qty 30
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
## 2014-12-14 NOTE — Discharge Instructions (Addendum)
°  Activity: 1.May walk up steps                2.No lifting more than ten pounds for four weeks.                 3.No driving for four weeks.                4.Stop any activity that causes chest pain, shortness of breath, dizziness, sweating or excessive weakness.                5.Avoid straining.                6.Continue with your breathing exercises daily.                7 Increase walking daily                8 continue breathing exercises Diet: Regular diet  Wound Care: May shower.  Clean wounds with mild soap and water daily. Contact the office at [phone] if any problems arise.    Notify office for any chest incision drainage or redness or fevers above 101.5
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
## 2014-12-15 MED ORDER — METHOCARBAMOL 500 MG PO TABS: 500.0000 mg | ORAL_TABLET | Freq: Three times a day (TID) | ORAL | Status: AC | PRN

## 2014-12-15 MED ORDER — OXYCODONE-ACETAMINOPHEN 5-325 MG PO TABS: 1.0000 | ORAL_TABLET | ORAL | Status: AC | PRN
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
## 2014-12-29 ENCOUNTER — Other Ambulatory Visit: Payer: Self-pay | Admitting: Surgery

## 2014-12-29 DIAGNOSIS — S21139A Puncture wound without foreign body of unspecified front wall of thorax without penetration into thoracic cavity, initial encounter: Principal | ICD-10-CM

## 2014-12-29 DIAGNOSIS — W3400XA Accidental discharge from unspecified firearms or gun, initial encounter: Secondary | ICD-10-CM

## 2014-12-30 ENCOUNTER — Encounter: Payer: Self-pay | Admitting: Surgery

## 2014-12-30 ENCOUNTER — Ambulatory Visit (INDEPENDENT_AMBULATORY_CARE_PROVIDER_SITE_OTHER): Payer: Self-pay | Admitting: Surgery

## 2014-12-30 ENCOUNTER — Ambulatory Visit
Admission: RE | Admit: 2014-12-30 | Discharge: 2014-12-30 | Disposition: A | Discharge status: Home / Self Care | Payer: No Typology Code available for payment source | Source: Ambulatory Visit | Attending: Surgery | Admitting: Surgery

## 2014-12-30 VITALS — BP 106/69 | HR 80 | Resp 16 | Ht 69.0 in | Wt 170.0 lb

## 2014-12-30 DIAGNOSIS — S271XXD Traumatic hemothorax, subsequent encounter: Secondary | ICD-10-CM

## 2014-12-30 DIAGNOSIS — W3400XD Accidental discharge from unspecified firearms or gun, subsequent encounter: Secondary | ICD-10-CM

## 2014-12-30 DIAGNOSIS — W3400XA Accidental discharge from unspecified firearms or gun, initial encounter: Secondary | ICD-10-CM

## 2014-12-30 DIAGNOSIS — S21139A Puncture wound without foreign body of unspecified front wall of thorax without penetration into thoracic cavity, initial encounter: Principal | ICD-10-CM

## 2014-12-30 DIAGNOSIS — S2190XD Unspecified open wound of unspecified part of thorax, subsequent encounter: Secondary | ICD-10-CM

## 2014-12-31 ENCOUNTER — Encounter: Payer: Self-pay | Admitting: Surgery
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
## 2015-01-04 ENCOUNTER — Telehealth (HOSPITAL_COMMUNITY): Payer: Self-pay
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
## 2015-01-08 LAB — CULTURE, RESPIRATORY W GRAM STAIN

## 2015-01-08 LAB — CULTURE, RESPIRATORY

## 2015-01-13 ENCOUNTER — Ambulatory Visit (INDEPENDENT_AMBULATORY_CARE_PROVIDER_SITE_OTHER): Payer: Self-pay | Admitting: *Deleted

## 2015-01-13 DIAGNOSIS — S2690XA Unspecified injury of heart, unspecified with or without hemopericardium, initial encounter: Secondary | ICD-10-CM

## 2015-01-13 DIAGNOSIS — W3400XD Accidental discharge from unspecified firearms or gun, subsequent encounter: Secondary | ICD-10-CM

## 2015-01-13 NOTE — Progress Notes (Signed)
Mr. Johnny George comes to the office with a complaint of something sticking out of his incision.  On exam, there is a piece of hard suture in the abdominal incision.  Dr Laneta SimmersBartle saw him and advised him to be seen by the trauma service to decide what should be done.  I will call their service to arrange for an appointment and call him.  Mr. Johnny George understands.

## 2015-01-14 ENCOUNTER — Telehealth (HOSPITAL_COMMUNITY): Payer: Self-pay

## 2015-01-14 NOTE — Telephone Encounter (Signed)
Dr. Sharee PimpleBartle's office called about a retained abdominal suture. I called the patient and scheduled him for a clinic appointment on 1/27.

## 2015-01-20 ENCOUNTER — Other Ambulatory Visit (INDEPENDENT_AMBULATORY_CARE_PROVIDER_SITE_OTHER): Payer: Self-pay | Admitting: General Surgery

## 2015-01-29 ENCOUNTER — Ambulatory Visit: Payer: Self-pay | Attending: Internal Medicine | Admitting: Internal Medicine

## 2015-01-29 ENCOUNTER — Encounter: Payer: Self-pay | Admitting: Internal Medicine

## 2015-01-29 VITALS — BP 108/64 | HR 100 | Temp 97.7°F | Resp 16 | Wt 161.4 lb

## 2015-01-29 DIAGNOSIS — Y249XXD Unspecified firearm discharge, undetermined intent, subsequent encounter: Secondary | ICD-10-CM | POA: Insufficient documentation

## 2015-01-29 DIAGNOSIS — Z87828 Personal history of other (healed) physical injury and trauma: Secondary | ICD-10-CM

## 2015-01-29 DIAGNOSIS — Z87891 Personal history of nicotine dependence: Secondary | ICD-10-CM | POA: Insufficient documentation

## 2015-01-29 DIAGNOSIS — R52 Pain, unspecified: Secondary | ICD-10-CM

## 2015-01-29 DIAGNOSIS — Z139 Encounter for screening, unspecified: Secondary | ICD-10-CM

## 2015-01-29 LAB — CBC WITH DIFFERENTIAL/PLATELET
BASOS PCT: 1 % (ref 0–1)
Basophils Absolute: 0.1 10*3/uL (ref 0.0–0.1)
Eosinophils Absolute: 0.3 10*3/uL (ref 0.0–0.7)
Eosinophils Relative: 4 % (ref 0–5)
HEMATOCRIT: 34.6 % — AB (ref 39.0–52.0)
HEMOGLOBIN: 10.7 g/dL — AB (ref 13.0–17.0)
LYMPHS PCT: 40 % (ref 12–46)
Lymphs Abs: 3.2 10*3/uL (ref 0.7–4.0)
MCH: 23.9 pg — AB (ref 26.0–34.0)
MCHC: 30.9 g/dL (ref 30.0–36.0)
MCV: 77.2 fL — ABNORMAL LOW (ref 78.0–100.0)
MONO ABS: 0.5 10*3/uL (ref 0.1–1.0)
MONOS PCT: 6 % (ref 3–12)
MPV: 9.4 fL (ref 8.6–12.4)
Neutro Abs: 3.9 10*3/uL (ref 1.7–7.7)
Neutrophils Relative %: 49 % (ref 43–77)
PLATELETS: 451 10*3/uL — AB (ref 150–400)
RBC: 4.48 MIL/uL (ref 4.22–5.81)
RDW: 15.6 % — ABNORMAL HIGH (ref 11.5–15.5)
WBC: 8 10*3/uL (ref 4.0–10.5)

## 2015-01-29 MED ORDER — GABAPENTIN 100 MG PO CAPS: 100.0000 mg | ORAL_CAPSULE | Freq: Three times a day (TID) | ORAL | Status: AC
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
## 2015-01-30 LAB — TSH: TSH: 1.094 u[IU]/mL (ref 0.350–4.500)

## 2015-01-30 LAB — COMPLETE METABOLIC PANEL WITH GFR
ALBUMIN: 3.9 g/dL (ref 3.5–5.2)
ALK PHOS: 79 U/L (ref 39–117)
ALT: 13 U/L (ref 0–53)
AST: 12 U/L (ref 0–37)
BUN: 9 mg/dL (ref 6–23)
CHLORIDE: 102 meq/L (ref 96–112)
CO2: 28 mEq/L (ref 19–32)
CREATININE: 0.89 mg/dL (ref 0.50–1.35)
Calcium: 9.7 mg/dL (ref 8.4–10.5)
GFR, Est African American: >89 mL/min
Glucose, Bld: 86 mg/dL (ref 70–99)
Potassium: 4 mEq/L (ref 3.5–5.3)
SODIUM: 138 meq/L (ref 135–145)
Total Bilirubin: 0.3 mg/dL (ref 0.2–1.2)
Total Protein: 7.2 g/dL (ref 6.0–8.3)

## 2015-01-30 LAB — HEMOGLOBIN A1C
HEMOGLOBIN A1C: 5.3 % (ref <5.7)
Mean Plasma Glucose: 105 mg/dL (ref <117)

## 2015-01-30 LAB — VITAMIN D 25 HYDROXY (VIT D DEFICIENCY, FRACTURES): Vit D, 25-Hydroxy: 9 ng/mL — ABNORMAL LOW (ref 30–100)

## 2015-02-04 ENCOUNTER — Telehealth: Payer: Self-pay

## 2015-02-04 MED ORDER — VITAMIN D (ERGOCALCIFEROL) 1.25 MG (50000 UNIT) PO CAPS: 50000.0000 [IU] | ORAL_CAPSULE | ORAL | Status: AC

## 2015-02-04 NOTE — Telephone Encounter (Signed)
Patient not available Unable to leave message No voice mail on this number Prescription sent to pharmacy

## 2015-02-04 NOTE — Telephone Encounter (Signed)
-----   Message from Doris Cheadleeepak Advani, MD sent at 02/01/2015 11:32 AM EST ----- Blood work reviewed, noticed low vitamin D, call patient advise to start ergocalciferol 50,000 units once a week for the duration of  12 weeks. Also let the patient know  that his hemoglobin/anemia is improving.

## 2015-04-28 ENCOUNTER — Ambulatory Visit (HOSPITAL_COMMUNITY)
Admission: RE | Admit: 2015-04-28 | Discharge: 2015-04-28 | Disposition: A | Discharge status: Home / Self Care | Payer: Medicaid Other | Source: Ambulatory Visit | Attending: Internal Medicine | Admitting: Internal Medicine

## 2015-04-28 DIAGNOSIS — T148 Other injury of unspecified body region: Secondary | ICD-10-CM | POA: Insufficient documentation

## 2015-04-28 DIAGNOSIS — W3400XD Accidental discharge from unspecified firearms or gun, subsequent encounter: Secondary | ICD-10-CM | POA: Diagnosis not present

## 2015-04-28 DIAGNOSIS — Z87828 Personal history of other (healed) physical injury and trauma: Secondary | ICD-10-CM

## 2015-04-28 DIAGNOSIS — J90 Pleural effusion, not elsewhere classified: Secondary | ICD-10-CM | POA: Diagnosis present

## 2015-04-29 ENCOUNTER — Telehealth: Payer: Self-pay

## 2015-04-29 NOTE — Telephone Encounter (Signed)
Spoke with patient;s mom and she is aware of his chest x ray results
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
## 2015-05-07 ENCOUNTER — Emergency Department (HOSPITAL_COMMUNITY)
Admission: EM | Admit: 2015-05-07 | Discharge: 2015-05-10 | Disposition: A | Discharge status: Home / Self Care | Payer: Medicaid Other | Attending: Emergency Medicine | Admitting: Emergency Medicine

## 2015-05-07 ENCOUNTER — Encounter (HOSPITAL_COMMUNITY): Payer: Self-pay | Admitting: Emergency Medicine

## 2015-05-07 DIAGNOSIS — R45850 Homicidal ideations: Secondary | ICD-10-CM | POA: Diagnosis present

## 2015-05-07 DIAGNOSIS — F419 Anxiety disorder, unspecified: Secondary | ICD-10-CM | POA: Diagnosis not present

## 2015-05-07 DIAGNOSIS — R55 Syncope and collapse: Secondary | ICD-10-CM | POA: Diagnosis not present

## 2015-05-07 DIAGNOSIS — F319 Bipolar disorder, unspecified: Secondary | ICD-10-CM | POA: Diagnosis present

## 2015-05-07 DIAGNOSIS — F3130 Bipolar disorder, current episode depressed, mild or moderate severity, unspecified: Secondary | ICD-10-CM | POA: Diagnosis not present

## 2015-05-07 DIAGNOSIS — Z720 Tobacco use: Secondary | ICD-10-CM | POA: Diagnosis not present

## 2015-05-07 DIAGNOSIS — Z79899 Other long term (current) drug therapy: Secondary | ICD-10-CM | POA: Diagnosis not present

## 2015-05-07 LAB — RAPID URINE DRUG SCREEN, HOSP PERFORMED
Amphetamines: NOT DETECTED
Barbiturates: NOT DETECTED
Benzodiazepines: NOT DETECTED
Cocaine: NOT DETECTED
Opiates: NOT DETECTED
Tetrahydrocannabinol: POSITIVE — AB

## 2015-05-07 LAB — COMPREHENSIVE METABOLIC PANEL
ALT: 17 U/L (ref 17–63)
AST: 21 U/L (ref 15–41)
Albumin: 3.7 g/dL (ref 3.5–5.0)
Alkaline Phosphatase: 50 U/L (ref 38–126)
Anion gap: 5 (ref 5–15)
BUN: 7 mg/dL (ref 6–20)
CHLORIDE: 106 mmol/L (ref 101–111)
CO2: 26 mmol/L (ref 22–32)
Calcium: 8.2 mg/dL — ABNORMAL LOW (ref 8.9–10.3)
Creatinine, Ser: 1.09 mg/dL (ref 0.61–1.24)
GFR calc Af Amer: >60 mL/min (ref >60)
GFR calc non Af Amer: >60 mL/min (ref >60)
GLUCOSE: 95 mg/dL (ref 65–99)
Potassium: 3.9 mmol/L (ref 3.5–5.1)
SODIUM: 137 mmol/L (ref 135–145)
Total Bilirubin: 0.7 mg/dL (ref 0.3–1.2)
Total Protein: 6.7 g/dL (ref 6.5–8.1)

## 2015-05-07 LAB — CBC
HEMATOCRIT: 38.1 % — AB (ref 39.0–52.0)
Hemoglobin: 12 g/dL — ABNORMAL LOW (ref 13.0–17.0)
MCH: 24.7 pg — ABNORMAL LOW (ref 26.0–34.0)
MCHC: 31.5 g/dL (ref 30.0–36.0)
MCV: 78.4 fL (ref 78.0–100.0)
Platelets: 419 10*3/uL — ABNORMAL HIGH (ref 150–400)
RBC: 4.86 MIL/uL (ref 4.22–5.81)
RDW: 15.6 % — ABNORMAL HIGH (ref 11.5–15.5)
WBC: 6.4 10*3/uL (ref 4.0–10.5)

## 2015-05-07 LAB — SALICYLATE LEVEL

## 2015-05-07 LAB — ACETAMINOPHEN LEVEL: Acetaminophen (Tylenol), Serum: <10 ug/mL — ABNORMAL LOW (ref 10–30)

## 2015-05-07 LAB — ETHANOL: ALCOHOL ETHYL (B): 124 mg/dL — AB (ref <5)

## 2015-05-07 MED ORDER — ONDANSETRON HCL 4 MG PO TABS: 4.0000 mg | ORAL_TABLET | Freq: Three times a day (TID) | ORAL | Status: DC | PRN

## 2015-05-07 MED ORDER — ALUM & MAG HYDROXIDE-SIMETH 200-200-20 MG/5ML PO SUSP: 30.0000 mL | ORAL | Status: DC | PRN

## 2015-05-07 MED ORDER — LORAZEPAM 1 MG PO TABS
1.0000 mg | ORAL_TABLET | Freq: Three times a day (TID) | ORAL | Status: DC | PRN
  Administered 2015-05-08: 1 mg via ORAL
  Filled 2015-05-07 (×2): qty 1

## 2015-05-07 MED ORDER — ACETAMINOPHEN 325 MG PO TABS
650.0000 mg | ORAL_TABLET | ORAL | Status: DC | PRN
  Administered 2015-05-08 – 2015-05-10 (×8): 650 mg via ORAL
  Filled 2015-05-07 (×8): qty 2
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
## 2015-05-07 NOTE — ED Notes (Signed)
Provided patient with blanket,pillo, sandwich and juice. Pt denies further needs at this time.

## 2015-05-08 DIAGNOSIS — F319 Bipolar disorder, unspecified: Secondary | ICD-10-CM | POA: Diagnosis present

## 2015-05-08 DIAGNOSIS — F419 Anxiety disorder, unspecified: Secondary | ICD-10-CM | POA: Insufficient documentation

## 2015-05-08 DIAGNOSIS — R45850 Homicidal ideations: Secondary | ICD-10-CM | POA: Diagnosis not present

## 2015-05-08 MED ORDER — DIVALPROEX SODIUM ER 500 MG PO TB24
1000.0000 mg | ORAL_TABLET | Freq: Every evening | ORAL | Status: DC | PRN
  Filled 2015-05-08 (×2): qty 2

## 2015-05-08 MED ORDER — DIVALPROEX SODIUM 500 MG PO DR TAB
500.0000 mg | DELAYED_RELEASE_TABLET | Freq: Two times a day (BID) | ORAL | Status: DC
  Administered 2015-05-08 – 2015-05-10 (×5): 500 mg via ORAL
  Filled 2015-05-08 (×5): qty 1

## 2015-05-08 MED ORDER — TRAZODONE HCL 100 MG PO TABS
100.0000 mg | ORAL_TABLET | Freq: Every day | ORAL | Status: DC
  Administered 2015-05-08: 100 mg via ORAL
  Filled 2015-05-08: qty 1

## 2015-05-08 MED ORDER — FLUOXETINE HCL 20 MG PO TABS
20.0000 mg | ORAL_TABLET | Freq: Every day | ORAL | Status: DC
  Administered 2015-05-08 – 2015-05-10 (×3): 20 mg via ORAL
  Filled 2015-05-08 (×4): qty 1

## 2015-05-08 NOTE — ED Notes (Signed)
Pt. up to rest room. Call button pressed by patient who was found lying on the floor in rest room 1. Pt. Stated that he felt he may have passed out related to medications he has taken this evening. Pt. C/o some pain over left eyebrow. No contusion noted. Pt. Help to his stretcher and taken back to his room. Orthostatic BPs done and recorded. EDP notified and EKG done at his request.

## 2015-05-08 NOTE — ED Notes (Signed)
TTS at bedside. 

## 2015-05-08 NOTE — BH Assessment (Signed)
Assessment completed. Consulted Ijeoma Nwaeze, NP who recommended inpatient treatment. Kaitlyn Szekalski, PA-C has been informed of the recommendation.  

## 2015-05-08 NOTE — ED Notes (Signed)
Pt. Unable to stand long enough to finish BP etc.

## 2015-05-08 NOTE — ED Notes (Signed)
Pt. To [facility] from ED ambulatory without difficulty, to room 35 . [facility]. Pt. Is alert and oriented, warm and dry in no distress. Pt. Denies SI, HI, and AVH. Pt. Calm and cooperative. Pt. Made aware of security cameras and Q15 minute rounds. Pt. Encouraged to let Nursing staff know of any concerns or needs.

## 2015-05-08 NOTE — ED Notes (Signed)
Pt given juice for comfort. When asked how he feels he states he feels depressed.  States he feels like he is trying to do the right thing and if we cannot help him he is afraid he will do "Something bad" and get locked up again.  Pt nice to RN, and appreciative of the juice.

## 2015-05-08 NOTE — ED Notes (Addendum)
Pt. In burgundy scrubs. Pt. And belongings searched and wanded by security.pt. Has 6 tapes and headphones, cell phone, and play station controllers,black and red shorts, pant, belt, black boots, white socks,cigarette lighter. Pt. Has 2 belongings bags. Pt. Belongings locked up at the nurses station in the yellow zone.
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
## 2015-05-08 NOTE — ED Notes (Signed)
Sandwich and soft drink given.  

## 2015-05-08 NOTE — ED Notes (Signed)
[facility]

## 2015-05-09 ENCOUNTER — Emergency Department (HOSPITAL_COMMUNITY): Payer: Medicaid Other

## 2015-05-09 DIAGNOSIS — F319 Bipolar disorder, unspecified: Secondary | ICD-10-CM

## 2015-05-09 DIAGNOSIS — R45850 Homicidal ideations: Secondary | ICD-10-CM

## 2015-05-09 LAB — CBC WITH DIFFERENTIAL/PLATELET
Basophils Absolute: 0 10*3/uL (ref 0.0–0.1)
Basophils Relative: 0 % (ref 0–1)
EOS ABS: 0.1 10*3/uL (ref 0.0–0.7)
Eosinophils Relative: 1 % (ref 0–5)
HEMATOCRIT: 34.9 % — AB (ref 39.0–52.0)
Hemoglobin: 10.7 g/dL — ABNORMAL LOW (ref 13.0–17.0)
Lymphocytes Relative: 31 % (ref 12–46)
Lymphs Abs: 2 10*3/uL (ref 0.7–4.0)
MCH: 24.5 pg — ABNORMAL LOW (ref 26.0–34.0)
MCHC: 30.7 g/dL (ref 30.0–36.0)
MCV: 79.9 fL (ref 78.0–100.0)
MONO ABS: 0.6 10*3/uL (ref 0.1–1.0)
Monocytes Relative: 9 % (ref 3–12)
Neutro Abs: 3.9 10*3/uL (ref 1.7–7.7)
Neutrophils Relative %: 59 % (ref 43–77)
PLATELETS: 367 10*3/uL (ref 150–400)
RBC: 4.37 MIL/uL (ref 4.22–5.81)
RDW: 15.6 % — AB (ref 11.5–15.5)
WBC: 6.6 10*3/uL (ref 4.0–10.5)

## 2015-05-09 LAB — COMPREHENSIVE METABOLIC PANEL
ALBUMIN: 2.9 g/dL — AB (ref 3.5–5.0)
ALK PHOS: 45 U/L (ref 38–126)
ALT: 13 U/L — ABNORMAL LOW (ref 17–63)
ANION GAP: 7 (ref 5–15)
AST: 18 U/L (ref 15–41)
BILIRUBIN TOTAL: 0.7 mg/dL (ref 0.3–1.2)
BUN: 7 mg/dL (ref 6–20)
CALCIUM: 7.5 mg/dL — AB (ref 8.9–10.3)
CHLORIDE: 110 mmol/L (ref 101–111)
CO2: 23 mmol/L (ref 22–32)
Creatinine, Ser: 1.11 mg/dL (ref 0.61–1.24)
GFR calc Af Amer: >60 mL/min (ref >60)
GFR calc non Af Amer: >60 mL/min (ref >60)
GLUCOSE: 94 mg/dL (ref 65–99)
Potassium: 3.9 mmol/L (ref 3.5–5.1)
Sodium: 140 mmol/L (ref 135–145)
Total Protein: 5.1 g/dL — ABNORMAL LOW (ref 6.5–8.1)

## 2015-05-09 LAB — I-STAT TROPONIN, ED: TROPONIN I, POC: 0 ng/mL (ref 0.00–0.08)

## 2015-05-09 LAB — I-STAT CG4 LACTIC ACID, ED: Lactic Acid, Venous: 1.61 mmol/L (ref 0.5–2.0)

## 2015-05-09 MED ORDER — SODIUM CHLORIDE 0.9 % IV SOLN
1000.0000 mL | Freq: Once | INTRAVENOUS | Status: AC
  Administered 2015-05-09: 1000 mL via INTRAVENOUS

## 2015-05-09 MED ORDER — SODIUM CHLORIDE 0.9 % IV SOLN
1000.0000 mL | INTRAVENOUS | Status: DC
  Administered 2015-05-09: 1000 mL via INTRAVENOUS

## 2015-05-09 NOTE — ED Notes (Signed)
Spoke with Wille CelesteJanie RN and made her aware that pt still had IV in rt forearm.NT took pt over before this Clinical research associatewriter removed. Janie RN would take IV out. Belonging bag x 2 transferred with pt. Pt verified belongings before transfer.

## 2015-05-09 NOTE — ED Notes (Signed)
Report received from Johnny George. Pt. Alert and oriented in no distress denies SI, HI, AVH and pain.  Pt. Instructed to come to me with problems or concerns.Will continue to monitor for safety via security cameras and Q 15 minute checks. 

## 2015-05-09 NOTE — ED Notes (Signed)
Pt. Noted sleeping in room. No complaints or concerns voiced. No distress or abnormal behavior noted. Will continue to monitor with security cameras. Q 15 minute rounds continue. 

## 2015-05-09 NOTE — ED Notes (Signed)
   05/09/15 0100  Follow Up  MD notified (Yes)  Time MD notified [id number]  Family notified Yes-comment  Time family notified 0015  Additional tests No  Simple treatment Ice  Progress note created (see row info) Yes  Adult Fall Risk Assessment  Patient's Fall Risk High Fall Risk (>13 points)  Adult Fall Risk Interventions  Required Bundle Interventions *See Row Information* High fall risk - low, moderate, and high requirements implemented  Additional Interventions Assess orthostatic BP  Fall with Injury Screening  Risk For Fall Injury- See Row Information  None identified  Intervention(s) for 2 or more risk criteria identified Low Bed

## 2015-05-09 NOTE — ED Notes (Addendum)
Dr s and josephine NP into see, ice pack given for face

## 2015-05-09 NOTE — ED Notes (Signed)
Cold pack applied to left eyebrow since minor swelling noted. Pt. Still c/o 6 pain left eyebrow.
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
## 2015-05-09 NOTE — ED Notes (Signed)
Yellow socks and fall risk arm band applied

## 2015-05-09 NOTE — ED Provider Notes (Signed)
10:03 AM BP has improved. Pt sitting up eating breakfast. No complaints. [facility]   Purvis SheffieldForrest Oluwadarasimi Favor, MD 05/09/15 1146

## 2015-05-09 NOTE — ED Provider Notes (Signed)
Patient had syncopal episode while back in.  Psych ED.  Patient has been running low blood pressures throughout the evening.  Patient reports that he has not been eating or drinking well prior to admission to the ED.  He denies any chest pain or shortness of breath.  He reports some pain over his left eye but denies any severe headache or nausea.  Patient examined.  His contusion to left eyebrow.  TMs normal bilaterally.  Heart with regular rhythm and rate.  No murmurs, rubs or gallops.  Lungs are clear to auscultation bilaterally.  Patient is asymptomatic.  Repeat orthostatics after 2 L of fluid, improved.  Repeat labs show a drop in hemoglobin most likely due to dilution.  We'll continue to monitor on the medical side to the morning.  Results for orders placed or performed during the hospital encounter of 05/07/15  Acetaminophen level  Result Value Ref Range   Acetaminophen (Tylenol), Serum <10 (L) 10 - 30 ug/mL  CBC  Result Value Ref Range   WBC 6.4 4.0 - 10.5 K/uL   RBC 4.86 4.22 - 5.81 MIL/uL   Hemoglobin 12.0 (L) 13.0 - 17.0 g/dL   HCT 16.138.1 (L) 09.639.0 - 04.552.0 %   MCV 78.4 78.0 - 100.0 fL   MCH 24.7 (L) 26.0 - 34.0 pg   MCHC 31.5 30.0 - 36.0 g/dL   RDW 40.915.6 (H) 81.111.5 - 91.415.5 %   Platelets 419 (H) 150 - 400 K/uL  Comprehensive metabolic panel  Result Value Ref Range   Sodium 137 135 - 145 mmol/L   Potassium 3.9 3.5 - 5.1 mmol/L   Chloride 106 101 - 111 mmol/L   CO2 26 22 - 32 mmol/L   Glucose, Bld 95 65 - 99 mg/dL   BUN 7 6 - 20 mg/dL   Creatinine, Ser 7.821.09 0.61 - 1.24 mg/dL   Calcium 8.2 (L) 8.9 - 10.3 mg/dL   Total Protein 6.7 6.5 - 8.1 g/dL   Albumin 3.7 3.5 - 5.0 g/dL   AST 21 15 - 41 U/L   ALT 17 17 - 63 U/L   Alkaline Phosphatase 50 38 - 126 U/L   Total Bilirubin 0.7 0.3 - 1.2 mg/dL   GFR calc non Af Amer >60 >60 mL/min   GFR calc Af Amer >60 >60 mL/min   Anion gap 5 5 - 15  Ethanol (ETOH)  Result Value Ref Range   Alcohol, Ethyl (B) 124 (H) <5 mg/dL  Salicylate level   Result Value Ref Range   Salicylate Lvl <4.0 2.8 - 30.0 mg/dL  Urine Drug Screen  Result Value Ref Range   Opiates NONE DETECTED NONE DETECTED   Cocaine NONE DETECTED NONE DETECTED   Benzodiazepines NONE DETECTED NONE DETECTED   Amphetamines NONE DETECTED NONE DETECTED   Tetrahydrocannabinol POSITIVE (A) NONE DETECTED   Barbiturates NONE DETECTED NONE DETECTED  Comprehensive metabolic panel  Result Value Ref Range   Sodium 140 135 - 145 mmol/L   Potassium 3.9 3.5 - 5.1 mmol/L   Chloride 110 101 - 111 mmol/L   CO2 23 22 - 32 mmol/L   Glucose, Bld 94 65 - 99 mg/dL   BUN 7 6 - 20 mg/dL   Creatinine, Ser 9.561.11 0.61 - 1.24 mg/dL   Calcium 7.5 (L) 8.9 - 10.3 mg/dL   Total Protein 5.1 (L) 6.5 - 8.1 g/dL   Albumin 2.9 (L) 3.5 - 5.0 g/dL   AST 18 15 - 41 U/L   ALT 13 (L)  17 - 63 U/L   Alkaline Phosphatase 45 38 - 126 U/L   Total Bilirubin 0.7 0.3 - 1.2 mg/dL   GFR calc non Af Amer >60 >60 mL/min   GFR calc Af Amer >60 >60 mL/min   Anion gap 7 5 - 15  CBC with Differential  Result Value Ref Range   WBC 6.6 4.0 - 10.5 K/uL   RBC 4.37 4.22 - 5.81 MIL/uL   Hemoglobin 10.7 (L) 13.0 - 17.0 g/dL   HCT 16.134.9 (L) 09.639.0 - 04.552.0 %   MCV 79.9 78.0 - 100.0 fL   MCH 24.5 (L) 26.0 - 34.0 pg   MCHC 30.7 30.0 - 36.0 g/dL   RDW 40.915.6 (H) 81.111.5 - 91.415.5 %   Platelets 367 150 - 400 K/uL   Neutrophils Relative % 59 43 - 77 %   Neutro Abs 3.9 1.7 - 7.7 K/uL   Lymphocytes Relative 31 12 - 46 %   Lymphs Abs 2.0 0.7 - 4.0 K/uL   Monocytes Relative 9 3 - 12 %   Monocytes Absolute 0.6 0.1 - 1.0 K/uL   Eosinophils Relative 1 0 - 5 %   Eosinophils Absolute 0.1 0.0 - 0.7 K/uL   Basophils Relative 0 0 - 1 %   Basophils Absolute 0.0 0.0 - 0.1 K/uL  I-Stat CG4 Lactic Acid, ED  Result Value Ref Range   Lactic Acid, Venous 1.61 0.5 - 2.0 mmol/L  I-stat troponin, ED  Result Value Ref Range   Troponin i, poc 0.00 0.00 - 0.08 ng/mL   Comment 3           Dg Chest Port 1 View  05/09/2015   CLINICAL DATA:   Syncope. Patient was lying on the floor in the restroom. Patient may have passed out. Pain over the left eyebrow.  EXAM: PORTABLE CHEST - 1 VIEW  COMPARISON:  None.  FINDINGS: Postoperative changes in the mediastinum. Normal heart size and pulmonary vascularity. No focal airspace disease or consolidation in the lungs. No blunting of costophrenic angles. No pneumothorax. Mediastinal contours appear intact.  IMPRESSION: No active disease.   Electronically Signed   By: Burman NievesWilliam  Stevens M.D.   On: 05/09/2015 03:42      Marisa Severinlga Angus Amini, MD 05/09/15 [phone]

## 2015-05-09 NOTE — ED Notes (Signed)
Pt. Transferred to ED bed 11 per Dr. Sharman Cheektter's orders. [facility].

## 2015-05-09 NOTE — ED Notes (Signed)
Bed: [id number] Expected date:  Expected time:  Means of arrival:  Comments: Room 35

## 2015-05-09 NOTE — ED Notes (Signed)
PT DID NOT FALL. UNABLE TO REMOVE THIS DOCUMENTATION. ONLY VALIDATED VITAL SIGNS PER EDP HARRISON'S REQUEST.

## 2015-05-09 NOTE — ED Notes (Signed)
On the phone 

## 2015-05-09 NOTE — Progress Notes (Signed)
CSW contacted outside facilities which were all at capacity.  CSW faxed referrals to the following inpatient psych facilities that are at capacity, but taking referrals:  [facility]  Juandedios Dudash LCSW,LCAS Behavioral Health Disposition CSW [phone]   
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
## 2015-05-09 NOTE — ED Notes (Signed)
MD at bedside. 

## 2015-05-09 NOTE — ED Notes (Signed)
Pt. Noted in room. No complaints or concerns voiced. No distress or abnormal behavior noted. Will continue to monitor with security cameras. Q 15 minute rounds continue. 

## 2015-05-09 NOTE — ED Notes (Signed)
Pt. Noted in room. No distress or abnormal behavior noted. Will continue to monitor with security cameras. Q 15 minute rounds continue. 

## 2015-05-10 DIAGNOSIS — F3130 Bipolar disorder, current episode depressed, mild or moderate severity, unspecified: Secondary | ICD-10-CM | POA: Diagnosis not present

## 2015-05-10 MED ORDER — DIVALPROEX SODIUM 500 MG PO DR TAB: 500.0000 mg | DELAYED_RELEASE_TABLET | Freq: Two times a day (BID) | ORAL | Status: AC

## 2015-05-10 MED ORDER — FLUOXETINE HCL 20 MG PO TABS: 20.0000 mg | ORAL_TABLET | Freq: Every day | ORAL | Status: AC

## 2015-05-10 NOTE — ED Notes (Signed)
Pt. Noted sleeping in room. No complaints or concerns voiced. No distress or abnormal behavior noted. Will continue to monitor with security cameras. Q 15 minute rounds continue. 

## 2015-05-10 NOTE — ED Notes (Signed)
Pt. Noted sleeping in room. No complaints or concerns voiced. No distress or abnormal behavior noted. Will continue to monitor with security cameras. Q 15 minute rounds continue.l 
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
## 2015-05-10 NOTE — Progress Notes (Signed)
Dear _______Keith D Womack_________________:  Bonita QuinYou have been approved to have the prescriptions written by your discharging physician filled through our [facility] ([facility]) program. This program allows for a one-time (no refills) 34-day supply of selected medications for a low copay amount.  The copay is $3.00 per prescription. For instance, if you have one prescription, you will pay $3.00; for two prescriptions, you pay $6.00; for three prescriptions, you pay $9.00; and so on.  Only certain pharmacies are participating in this program with [facility]. You will need to select one of the pharmacies from the attached list and take your prescriptions, this letter, and your photo ID to one of the participating pharmacies.   We are excited that you are able to use the [facility] program to get your medications. These prescriptions must be filled within 7 days of hospital discharge or they will no longer be valid for the [facility] program. Should you have any problems with your prescriptions please contact your case management team member at [phone].  Thank you,   Foristell MATCH PHARMACIES   [facility], [address], [place], [place] [facility], [address], [place], [place] [facility] [address], [place], [place] [facility],[address], [address], [facility], [place] [facility] [facility], [address], [place], [place] [address] [facility], [address], [place], [place] [facility], [address], [address], [place], [place]   [facility] [address], [place], [place]   [id number], [place], [place]  [address] [place], [place], [place]   [address], [place], [place] [address], [place], [place]   [address], [place], [place] [address], [place], [place]    [address], [place], [place] [address], [place], [place]   [id number] [address], [place], [place] [id number] [address], [place], [place]   [address]  [employer]         [address] [place] [address], [place], [place]     [id number], [place], [place] [id number] [address], [place], [place]                                 [address], [place]  [address], [place], [place]                                  [address], [place] [address], [place], [place]                                                           [address], [place] [id number] [address], [place], [place]                              [address], [place] [address], [place], [place]  [place], [place], [place], [place] [address], [place], [place], [place], [place] [employer], [place], [address] [place], [place] [address], [place], [place]                                       [place], [place], [place] [employer], [place], [place]                                           [place], [place], [place], [place], [place], [place], [place], [place], [place], [place], [place], [place], [place]

## 2015-05-10 NOTE — Discharge Instructions (Signed)
For ongoing mental health and substance abuse treatment needs, you are advised to keep your existing appointments with [facility] and with [facility]:       [facility]      [address]      [place], [place] [address]      [phone]       [facility] ([facility])      [address], [address]      [place], [place] [address]      [phone]

## 2015-05-10 NOTE — BHH Suicide Risk Assessment (Signed)
Suicide Risk Assessment  Discharge Assessment   [facility] Suicide [facility]   Demographic Factors:  Male and Unemployed  Total Time spent with patient: 30 minutes   Musculoskeletal: Strength & Muscle Tone: within normal limits Gait & Station: normal Patient leans: N/A  Psychiatric Specialty Exam: Physical Exam  Review of Systems  Constitutional: Negative.   HENT: Negative.   Eyes: Negative.   Respiratory: Negative.   Cardiovascular: Negative.   Gastrointestinal: Negative.   Genitourinary: Negative.   Musculoskeletal: Negative.   Skin: Negative.   Neurological: Negative.   Endo/Heme/Allergies: Negative.   Psychiatric/Behavioral: Positive for depression.    Blood pressure 111/66, pulse 60, temperature 98.4 F (36.9 C), temperature source Oral, resp. rate 18, height 5\' 9"  (1.753 m), weight 68.947 kg (152 lb), SpO2 99 %.Body mass index is 22.44 kg/(m^2).  General Appearance: Casual  Eye Contact::  Good  Speech:  Normal Rate  Volume:  Normal  Mood:  Euthymic  Affect:  Congruent  Thought Process:  Coherent  Orientation:  Full (Time, Place, and Person)  Thought Content:  WDL  Suicidal Thoughts:  No  Homicidal Thoughts:  No  Memory:  Immediate;   Good Recent;   Good Remote;   Good  Judgement:  Good  Insight:  Good  Psychomotor Activity:  Normal  Concentration:  Good  Recall:  Good  Fund of Knowledge:Good  Language: Good  Akathisia:  No  Handed:  Right  AIMS (if indicated):     Assets:  Leisure Time Physical Health Resilience Social Support  ADL's:  Intact  Cognition: WNL  Sleep:         Has this patient used any form of tobacco in the last 30 days? (Cigarettes, Smokeless Tobacco, Cigars, and/or Pipes) Yes, A prescription for an FDA-approved tobacco cessation medication was offered at discharge and the patient refused  Mental Status Per Nursing Assessment::   On Admission:   Homicidal ideations  Current Mental Status by Physician: NA  Loss  Factors: NA  Historical Factors: NA  Risk Reduction Factors:   Living with another person, especially a relative, Positive social support and Positive therapeutic relationship  Continued Clinical Symptoms:  None  Cognitive Features That Contribute To Risk:  None    Suicide Risk:  Minimal: No identifiable suicidal ideation.  Patients presenting with no risk factors but with morbid ruminations; may be classified as minimal risk based on the severity of the depressive symptoms  Principal Problem: Bipolar 1 disorder, depressed Discharge Diagnoses:  Patient Active Problem List   Diagnosis Date Noted  . Bipolar 1 disorder, depressed [F31.9] 05/08/2015    Priority: High  . Homicidal ideation [R45.850]     Priority: High  . Anxiety [F41.9]   . URI (upper respiratory infection) [J06.9] 05/17/2013    Follow-up Information    Call [facility]    .   Why:  As needed Check to see if [facility] will [facility] you as a patient    Contact information:   [address] [facility] [place] [phone]      Call Please use the list of resources provided by ED case manager for family doctors, medications, dental etc.   Why:  As needed any of the resources provided.  You need to get a family doctor before June 09 2015 to assist with prescriptions and care    Contact information:   goodrx.com  www.AboutHD.co.[email or web address] Please get involved with [place] assist, [facility] and affordable care act  Plan Of Care/Follow-up recommendations:  Activity:  as tolerated Diet:  heart healthy diet  Is patient on multiple antipsychotic therapies at discharge:  No   Has Patient had three or more failed trials of antipsychotic monotherapy by history:  No  Recommended Plan for Multiple Antipsychotic Therapies: NA    Ianna Salmela, PMH-NP 05/10/2015, 2:29 PM

## 2015-05-10 NOTE — ED Notes (Signed)
Patient discharged to home.  All belongings returned.  Denies thoughts of harm to self or others.  Left the unit ambulatory with family.

## 2015-05-10 NOTE — BH Assessment (Signed)
BHH Assessment Progress Note  Per Leata MouseJanardhana Jonnalagadda, MD this pt does not require psychiatric hospitalization at this time.  [facility].  After speaking with patient is was found that he has [facility].  Both appointments are later this month.  His main obstacle to maintaining treatment has been inability to afford medications.  This Clinical research associatewriter has spoke to Sprint Nextel CorporationKim, the nurse case Production designer, theatre/television/filmmanager, who agrees to speak to the pt to see if she can provide him with any assistance.  Referral information for both [facility] has been included in his discharge instructions.  Pt's nurse, Dawnaly, has been informed of pt's status.  Doylene Canninghomas Candid Bovey, MA Triage Specialist [phone]
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
## 2015-06-20 ENCOUNTER — Emergency Department (HOSPITAL_COMMUNITY): Payer: Medicaid Other

## 2015-06-20 ENCOUNTER — Encounter (HOSPITAL_COMMUNITY): Payer: Self-pay | Admitting: Emergency Medicine

## 2015-06-20 ENCOUNTER — Emergency Department (HOSPITAL_COMMUNITY)
Admission: EM | Admit: 2015-06-20 | Discharge: 2015-06-20 | Disposition: A | Discharge status: Home / Self Care | Payer: Medicaid Other | Attending: Emergency Medicine | Admitting: Emergency Medicine

## 2015-06-20 DIAGNOSIS — Z720 Tobacco use: Secondary | ICD-10-CM | POA: Diagnosis not present

## 2015-06-20 DIAGNOSIS — F10920 Alcohol use, unspecified with intoxication, uncomplicated: Secondary | ICD-10-CM

## 2015-06-20 DIAGNOSIS — R079 Chest pain, unspecified: Secondary | ICD-10-CM | POA: Diagnosis present

## 2015-06-20 DIAGNOSIS — Z79899 Other long term (current) drug therapy: Secondary | ICD-10-CM | POA: Insufficient documentation

## 2015-06-20 DIAGNOSIS — Z87828 Personal history of other (healed) physical injury and trauma: Secondary | ICD-10-CM | POA: Diagnosis not present

## 2015-06-20 DIAGNOSIS — F10120 Alcohol abuse with intoxication, uncomplicated: Secondary | ICD-10-CM | POA: Insufficient documentation

## 2015-06-20 DIAGNOSIS — R0789 Other chest pain: Secondary | ICD-10-CM | POA: Insufficient documentation

## 2015-06-20 DIAGNOSIS — Y906 Blood alcohol level of 120-199 mg/100 ml: Secondary | ICD-10-CM | POA: Diagnosis not present

## 2015-06-20 LAB — BASIC METABOLIC PANEL
ANION GAP: 7 (ref 5–15)
BUN: 8 mg/dL (ref 6–20)
CALCIUM: 8 mg/dL — AB (ref 8.9–10.3)
CHLORIDE: 111 mmol/L (ref 101–111)
CO2: 23 mmol/L (ref 22–32)
CREATININE: 1.06 mg/dL (ref 0.61–1.24)
GFR calc Af Amer: >60 mL/min (ref >60)
GLUCOSE: 98 mg/dL (ref 65–99)
POTASSIUM: 3.7 mmol/L (ref 3.5–5.1)
SODIUM: 141 mmol/L (ref 135–145)

## 2015-06-20 LAB — CBC
HCT: 34.7 % — ABNORMAL LOW (ref 39.0–52.0)
HEMOGLOBIN: 11.1 g/dL — AB (ref 13.0–17.0)
MCH: 25.6 pg — ABNORMAL LOW (ref 26.0–34.0)
MCHC: 32 g/dL (ref 30.0–36.0)
MCV: 80.1 fL (ref 78.0–100.0)
Platelets: 401 10*3/uL — ABNORMAL HIGH (ref 150–400)
RBC: 4.33 MIL/uL (ref 4.22–5.81)
RDW: 16.7 % — AB (ref 11.5–15.5)
WBC: 8.1 10*3/uL (ref 4.0–10.5)

## 2015-06-20 LAB — I-STAT TROPONIN, ED
Troponin i, poc: 0.02 ng/mL (ref 0.00–0.08)
Troponin i, poc: 0 ng/mL (ref 0.00–0.08)
Troponin i, poc: 0.01 ng/mL (ref 0.00–0.08)

## 2015-06-20 LAB — ETHANOL: Alcohol, Ethyl (B): 178 mg/dL — ABNORMAL HIGH (ref <5)
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
## 2015-06-20 NOTE — ED Notes (Signed)
Bed: FB90 Expected date:  Expected time:  Means of arrival:  Comments: EMS 33yo chest pain from jail

## 2015-07-09 ENCOUNTER — Encounter (HOSPITAL_COMMUNITY): Payer: Self-pay | Admitting: Emergency Medicine

## 2015-08-10 ENCOUNTER — Emergency Department (HOSPITAL_COMMUNITY): Payer: Medicaid Other

## 2015-08-10 ENCOUNTER — Emergency Department (HOSPITAL_COMMUNITY)
Admission: EM | Admit: 2015-08-10 | Discharge: 2015-08-10 | Disposition: A | Discharge status: Home / Self Care | Payer: Medicaid Other | Attending: Emergency Medicine | Admitting: Emergency Medicine

## 2015-08-10 ENCOUNTER — Encounter (HOSPITAL_COMMUNITY): Payer: Self-pay | Admitting: Emergency Medicine

## 2015-08-10 DIAGNOSIS — Z720 Tobacco use: Secondary | ICD-10-CM | POA: Insufficient documentation

## 2015-08-10 DIAGNOSIS — Y998 Other external cause status: Secondary | ICD-10-CM | POA: Insufficient documentation

## 2015-08-10 DIAGNOSIS — Y9289 Other specified places as the place of occurrence of the external cause: Secondary | ICD-10-CM | POA: Diagnosis not present

## 2015-08-10 DIAGNOSIS — Y9389 Activity, other specified: Secondary | ICD-10-CM | POA: Insufficient documentation

## 2015-08-10 DIAGNOSIS — R21 Rash and other nonspecific skin eruption: Secondary | ICD-10-CM | POA: Diagnosis present

## 2015-08-10 DIAGNOSIS — M549 Dorsalgia, unspecified: Secondary | ICD-10-CM | POA: Diagnosis not present

## 2015-08-10 DIAGNOSIS — S40861A Insect bite (nonvenomous) of right upper arm, initial encounter: Secondary | ICD-10-CM | POA: Insufficient documentation

## 2015-08-10 DIAGNOSIS — R0789 Other chest pain: Secondary | ICD-10-CM | POA: Insufficient documentation

## 2015-08-10 DIAGNOSIS — W57XXXA Bitten or stung by nonvenomous insect and other nonvenomous arthropods, initial encounter: Secondary | ICD-10-CM | POA: Diagnosis not present

## 2015-08-10 DIAGNOSIS — Z79899 Other long term (current) drug therapy: Secondary | ICD-10-CM | POA: Diagnosis not present

## 2015-08-10 DIAGNOSIS — M791 Myalgia: Secondary | ICD-10-CM | POA: Insufficient documentation

## 2015-08-10 DIAGNOSIS — M7918 Myalgia, other site: Secondary | ICD-10-CM

## 2015-08-10 MED ORDER — HYDROCODONE-ACETAMINOPHEN 5-325 MG PO TABS
1.0000 | ORAL_TABLET | Freq: Once | ORAL | Status: AC
  Administered 2015-08-10: 1 via ORAL
  Filled 2015-08-10: qty 1

## 2015-08-10 MED ORDER — HYDROCORTISONE 1 % EX CREA: TOPICAL_CREAM | CUTANEOUS | Status: AC

## 2015-08-10 MED ORDER — ACETAMINOPHEN 500 MG PO TABS: 500.0000 mg | ORAL_TABLET | Freq: Four times a day (QID) | ORAL | Status: AC | PRN

## 2015-08-10 MED ORDER — DIPHENHYDRAMINE HCL 25 MG PO CAPS
25.0000 mg | ORAL_CAPSULE | Freq: Once | ORAL | Status: AC
  Administered 2015-08-10: 25 mg via ORAL
  Filled 2015-08-10: qty 1
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
## 2015-08-10 NOTE — Discharge Instructions (Signed)

## 2015-08-10 NOTE — ED Notes (Signed)
Pt c/o rash on right upper arm x 2 days. Pt has tried hydrocortisone cream but states not helping. Pt also c/o left upper back pain that has been hurting over the past couple days. Pt states that very painful when he sneezed and coughed yesterday.   Pt also wants an xray of right index finger bc painful when he makes a fist in his knuckle.  Pt has had that pain for long time but thought while he was here would like to get it checked out.

## 2015-10-20 ENCOUNTER — Emergency Department (INDEPENDENT_AMBULATORY_CARE_PROVIDER_SITE_OTHER)
Admission: EM | Admit: 2015-10-20 | Discharge: 2015-10-20 | Disposition: A | Discharge status: Home / Self Care | Payer: Medicaid Other | Source: Home / Self Care | Attending: Family Medicine | Admitting: Family Medicine

## 2015-10-20 ENCOUNTER — Encounter (HOSPITAL_COMMUNITY): Payer: Self-pay | Admitting: *Deleted

## 2015-10-20 DIAGNOSIS — G8929 Other chronic pain: Secondary | ICD-10-CM | POA: Diagnosis not present

## 2015-10-20 DIAGNOSIS — M545 Low back pain: Secondary | ICD-10-CM | POA: Diagnosis not present

## 2015-10-20 MED ORDER — BACLOFEN 10 MG PO TABS: 10.0000 mg | ORAL_TABLET | Freq: Three times a day (TID) | ORAL | Status: AC

## 2015-10-20 NOTE — Discharge Instructions (Signed)
See pain management center for further care.
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
## 2015-10-21 NOTE — ED Notes (Signed)
Called clinic to request referral to pain management. Called [facility] . They have requested copy of demographic sheet, and last clinic visit summary.
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
## 2016-01-18 IMAGING — CR DG CHEST 2V
2 series · 2 of 2 positions shown · non-contrast
Comparison: 12/07/2014

CLINICAL DATA: Pleural effusion

EXAM:
CHEST  2 VIEW

[chest lat]
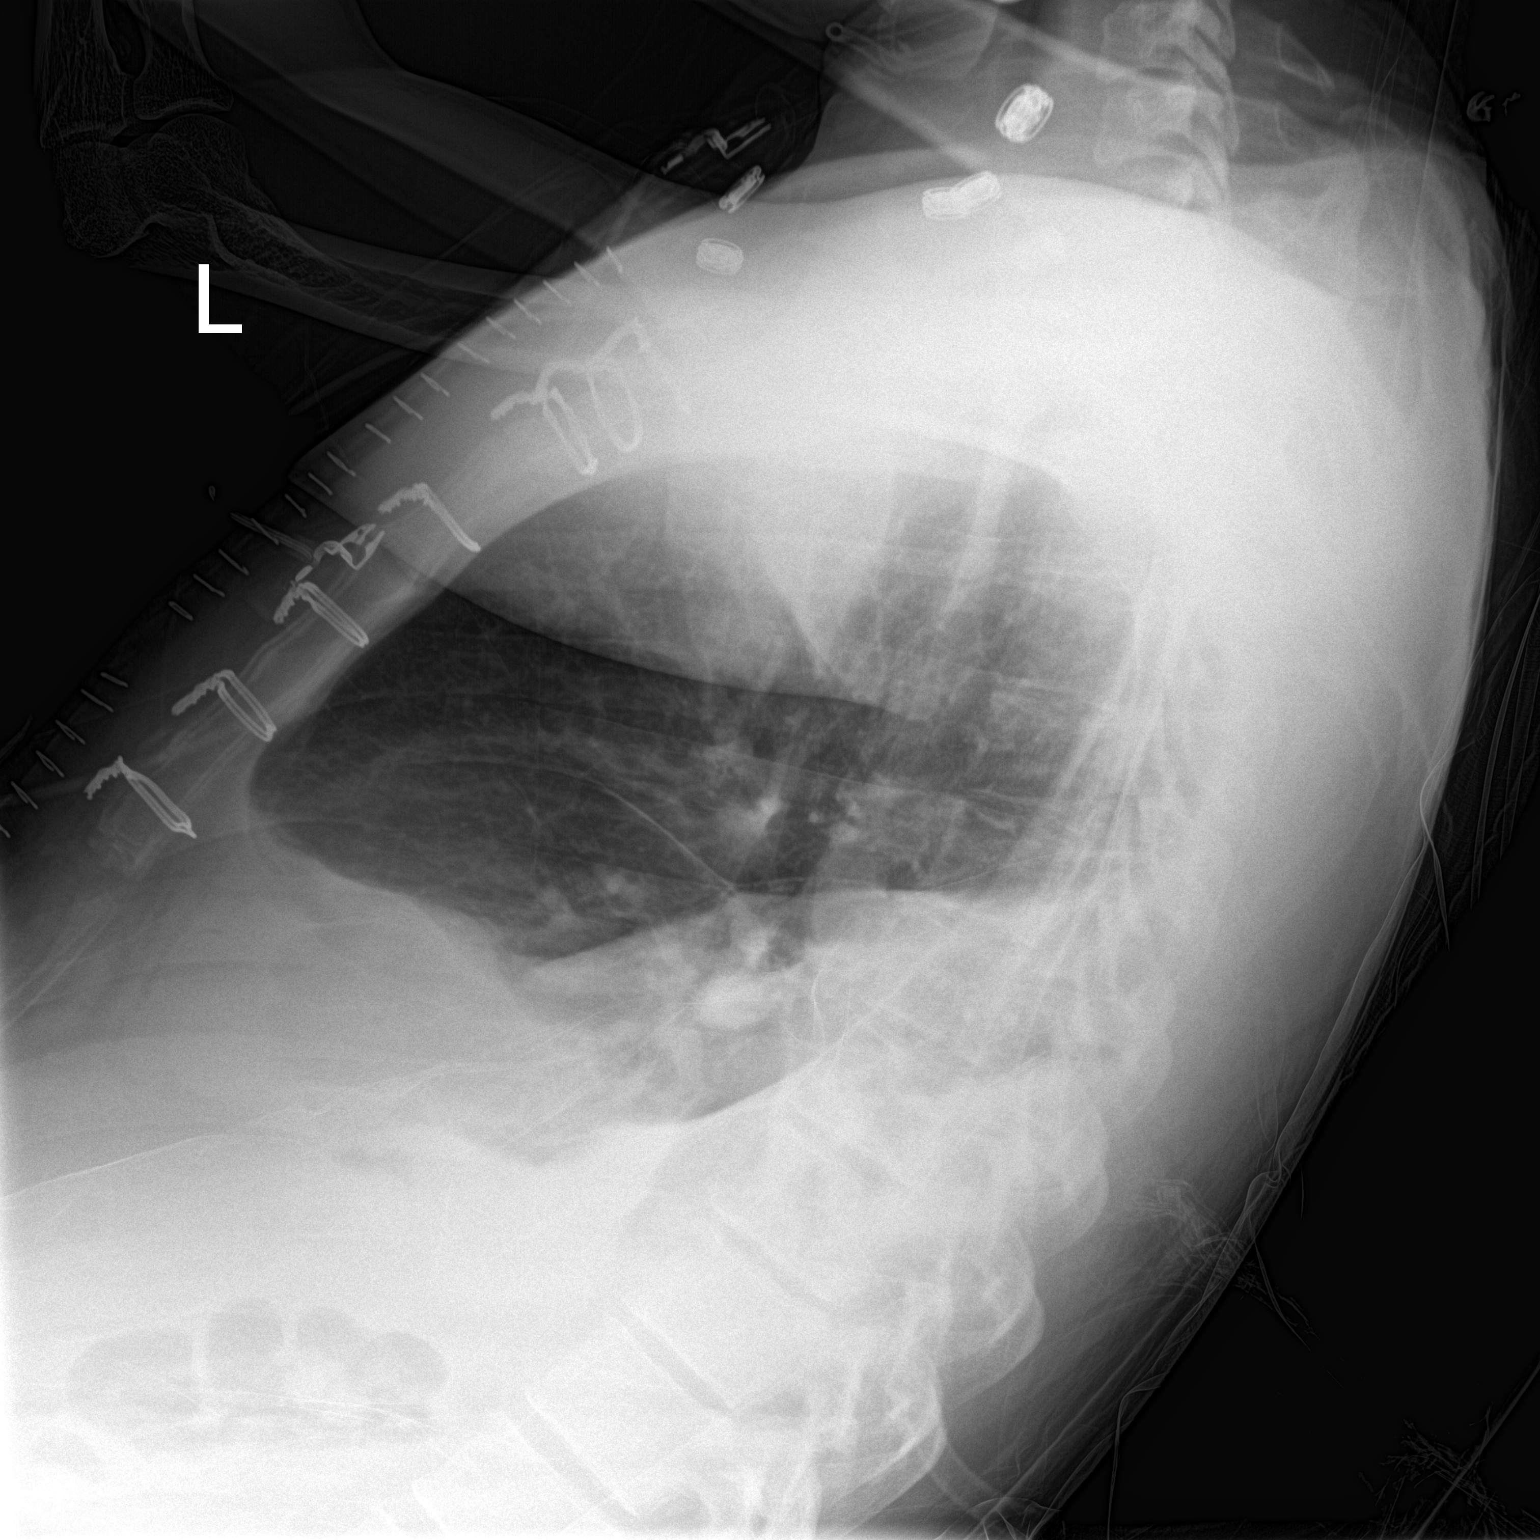

[chest ap]
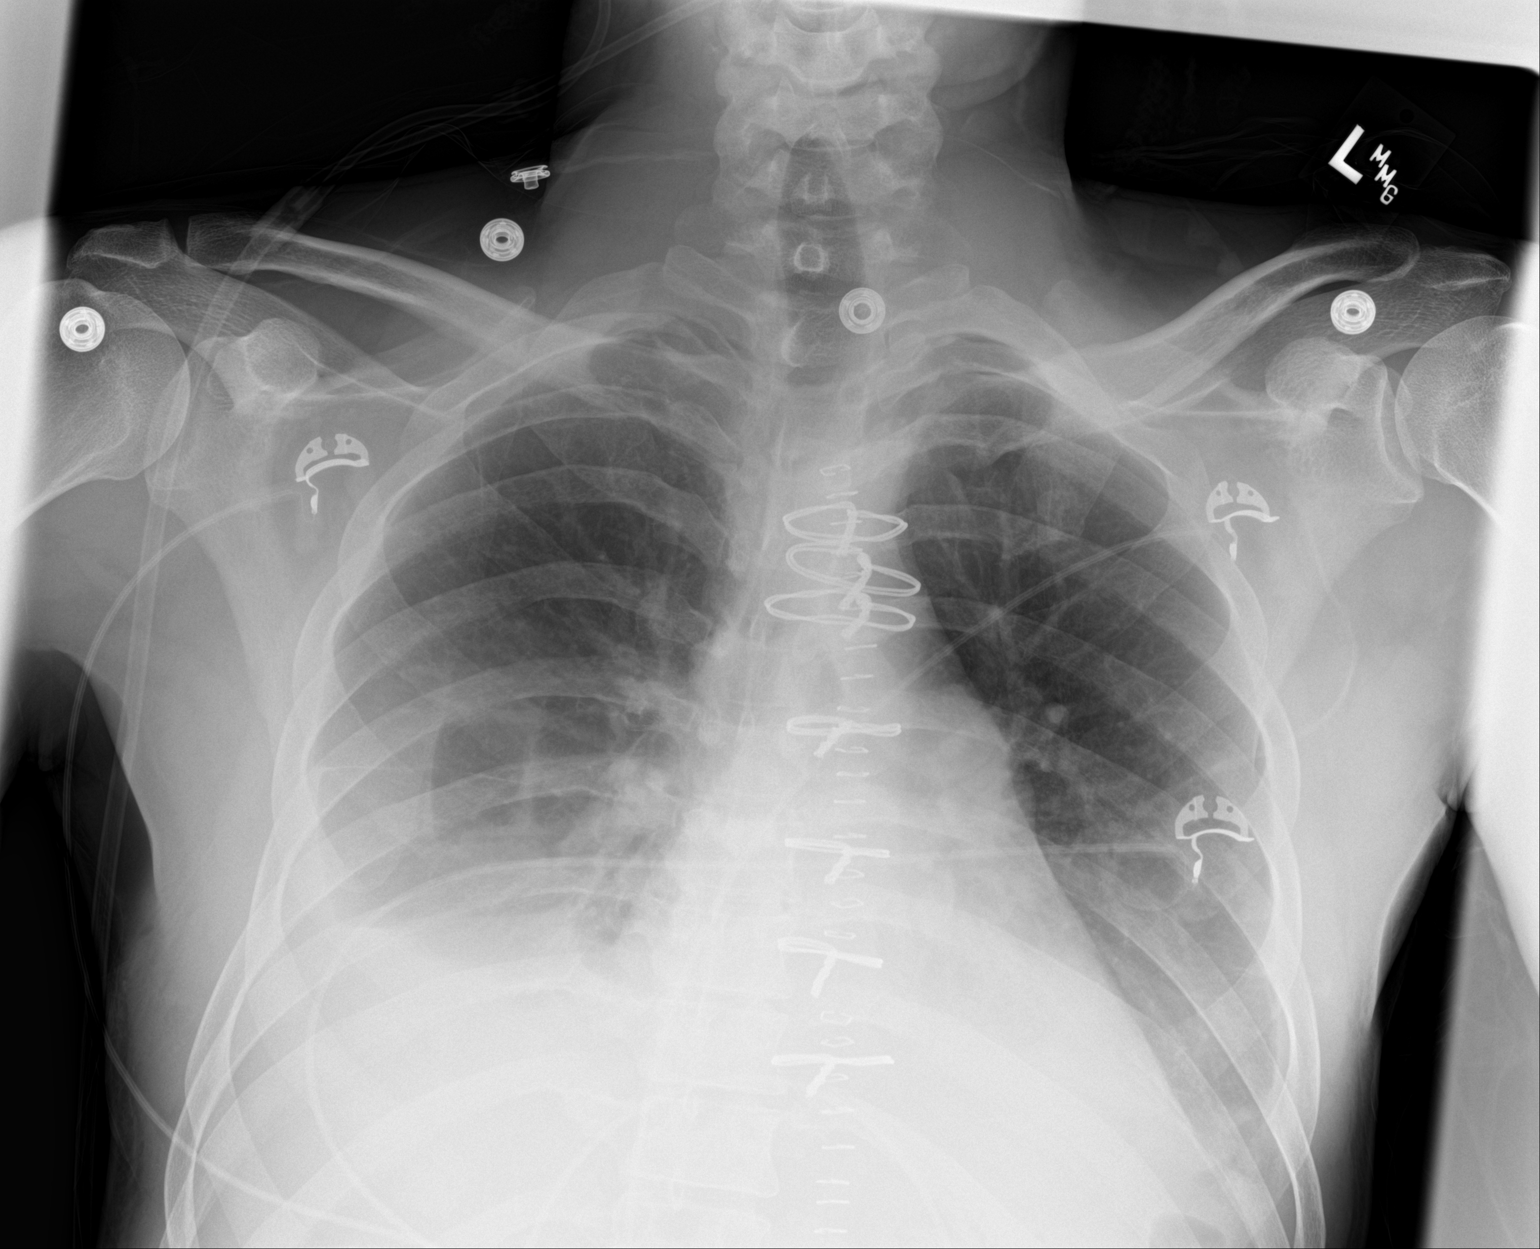

[2 of 2 positions shown; findings below may reference images not displayed]

FINDINGS: Small to moderate right pleural effusion, mildly increased.
Associated right lower lobe opacity, likely atelectasis.

Small left pleural effusion, unchanged.

No frank interstitial edema.  No pneumothorax.

The heart is top-normal in size.

Median sternotomy with overlying midline skin staples.

Interval removal of left IJ venous catheter.
IMPRESSION: Small to moderate right pleural effusion, mildly increased.
Associated right lower lobe opacity, likely atelectasis.

Small left pleural effusion, unchanged.
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
